# Patient Record
Sex: Female | Born: 1970
Health system: Southern US, Community
[De-identification: ages and names within clinical notes are randomized; demographics above are authoritative.]

## PROBLEM LIST (undated history)

## (undated) DIAGNOSIS — R011 Cardiac murmur, unspecified: Secondary | ICD-10-CM

## (undated) DIAGNOSIS — K219 Gastro-esophageal reflux disease without esophagitis: Secondary | ICD-10-CM

## (undated) HISTORY — DX: Cardiac murmur, unspecified: R01.1

## (undated) HISTORY — DX: Gastro-esophageal reflux disease without esophagitis: K21.9

---

## 1998-12-13 ENCOUNTER — Other Ambulatory Visit: Admission: RE | Admit: 1998-12-13 | Discharge: 1998-12-13 | Payer: Self-pay | Admitting: Gynecology

## 2000-02-05 ENCOUNTER — Inpatient Hospital Stay (HOSPITAL_COMMUNITY): Admission: AD | Admit: 2000-02-05 | Discharge: 2000-02-08 | Payer: Self-pay | Admitting: Obstetrics and Gynecology

## 2000-02-09 ENCOUNTER — Encounter: Admission: RE | Admit: 2000-02-09 | Discharge: 2000-02-28 | Payer: Self-pay | Admitting: Gynecology

## 2000-03-12 ENCOUNTER — Other Ambulatory Visit: Admission: RE | Admit: 2000-03-12 | Discharge: 2000-03-12 | Payer: Self-pay | Admitting: Obstetrics and Gynecology

## 2001-02-07 ENCOUNTER — Other Ambulatory Visit: Admission: RE | Admit: 2001-02-07 | Discharge: 2001-02-07 | Payer: Self-pay | Admitting: Gynecology

## 2002-03-06 ENCOUNTER — Other Ambulatory Visit: Admission: RE | Admit: 2002-03-06 | Discharge: 2002-03-06 | Payer: Self-pay | Admitting: Gynecology

## 2002-03-07 ENCOUNTER — Other Ambulatory Visit: Admission: RE | Admit: 2002-03-07 | Discharge: 2002-03-07 | Payer: Self-pay | Admitting: Gynecology

## 2002-11-05 ENCOUNTER — Inpatient Hospital Stay (HOSPITAL_COMMUNITY): Admission: AD | Admit: 2002-11-05 | Discharge: 2002-11-08 | Payer: Self-pay | Admitting: Obstetrics and Gynecology

## 2003-12-07 ENCOUNTER — Other Ambulatory Visit: Admission: RE | Admit: 2003-12-07 | Discharge: 2003-12-07 | Payer: Self-pay | Admitting: Gynecology

## 2004-12-19 ENCOUNTER — Other Ambulatory Visit: Admission: RE | Admit: 2004-12-19 | Discharge: 2004-12-19 | Payer: Self-pay | Admitting: Gynecology

## 2007-06-18 ENCOUNTER — Encounter: Admission: RE | Admit: 2007-06-18 | Discharge: 2007-06-18 | Payer: Self-pay | Admitting: Gastroenterology

## 2008-06-09 ENCOUNTER — Ambulatory Visit (HOSPITAL_BASED_OUTPATIENT_CLINIC_OR_DEPARTMENT_OTHER): Admission: RE | Admit: 2008-06-09 | Discharge: 2008-06-09 | Payer: Self-pay | Admitting: Orthopedic Surgery

## 2009-02-20 HISTORY — PX: BILATERAL CARPAL TUNNEL RELEASE: SHX6508

## 2010-03-13 ENCOUNTER — Encounter: Payer: Self-pay | Admitting: Gastroenterology

## 2010-06-01 LAB — POCT HEMOGLOBIN-HEMACUE: Hemoglobin: 13.5 g/dL (ref 12.0–15.0)

## 2010-07-05 NOTE — Op Note (Signed)
[name], [name]              ACCOUNT NO.:  [id number]   MEDICAL RECORD NO.:  [id number]          PATIENT TYPE:  AMB   LOCATION:  DSC                          FACILITY:  [facility]   PHYSICIAN:  Katy Fitch. Sypher, M.D. DATE OF BIRTH:  05-Sep-1970   DATE OF PROCEDURE:  06/09/2008  DATE OF DISCHARGE:                               OPERATIVE REPORT   PREOPERATIVE DIAGNOSIS:  Chronic right carpal tunnel syndrome with  positive electrodiagnostic studies and unsuccessful resolution followed  attempted nonoperative measures including steroid injection, splinting,  activity modification, and anti-inflammatory medication.   POSTOPERATIVE DIAGNOSIS:  Chronic right carpal tunnel syndrome with  positive electrodiagnostic studies and unsuccessful resolution followed  attempted nonoperative measures including steroid injection, splinting,  activity modification, and anti-inflammatory medication.   OPERATION:  Release of right transverse carpal ligament.   OPERATING SURGEON:  Katy Fitch. Sypher, MD   ASSISTANT:  None.   ANESTHESIA:  General by LMA.   SUPERVISING ANESTHESIOLOGIST:  Burna Forts, MD   INDICATIONS:  Katherine Horn is a 40 year old woman referred through the  courtesy of [facility] for evaluation and management of an  8-year history of right hand numbness.  Clinical examination revealed  signs of carpal tunnel syndrome.  Electrodiagnostic studies confirmed  bilateral carpal tunnel syndrome.   She was initially treated with steroid injection and splinting.  She had  been using night splinting for a while.  Unfortunately, she was unable  to be relieved for symptoms and now presents for release of a right  transverse carpal ligament.   After informed consent, she is brought to the operating room at this  time.   PROCEDURE IN DETAIL:  Preoperatively, she was interviewed by Dr.  Jacklynn Bue.  General anesthesia by LMA technique was recommended and  accepted.  She is  brought to room 6 of the [facility], placed  in supine position on the operating table, and under Dr. Marlane Mingle  direct supervision, general anesthesia by LMA technique induced.  The  right arm was prepped with Betadine soap and solution and sterilely  draped.  A pneumatic tourniquet was applied to the proximal right  brachium.   Following exsanguination of the right arm with an Esmarch bandage, the  arterial tourniquet was inflated to 220 mmHg.  The procedure commenced  with a short incision in line of the ring finger of the palm.  Subcutaneous tissues were carefully divided revealing the palmar fascia.  This was split longitudinally to the common sensory branch of the median  nerve.   These were followed back to the transverse carpal ligament which was  gently isolated from the median nerve proper with a Insurance risk surveyor.  A tract was created above and below the transverse carpal ligament  followed by release of scissors extending into the distal forearm.  This  widely opened the carpal canal.  No mass or predicaments were noted.   Bleeding points along the margin of the released ligament were  electrocauterized with bipolar current.   The wound was then repaired with intradermal 3-0 Prolene suture.  A  compressive dressing was applied  with a Steri-Strip, sterile gauze,  sterile Webril, and a volar plaster splint maintaining the wrist in 5  degrees of dorsiflexion.  There were no apparent complications.   For aftercare, Katherine Horn is provided a prescription for Percocet 5 mg  one p.o. q.4-6 h. p.r.n. pain, 20 tablets without refill.  She is also  encouraged to use Aleve or Advil as needed.  She also had a 2% lidocaine  block placed in her wound and around the median nerve for postoperative  analgesia.      Katy Fitch Sypher, M.D.  Electronically Signed     RVS/MEDQ  D:  06/09/2008  T:  06/10/2008  Job:  [id number]   cc:   Donia Guiles, M.D.

## 2010-07-08 NOTE — Discharge Summary (Signed)
[facility]  Patient:    Katherine Horn, Katherine Horn                     MRN: [id number] Adm. Date:  [id number] Disc. Date: [id number] Attending:  Marcelle Overlie Dictator:   Leilani Able, P.A.                           Discharge Summary  FINAL DIAGNOSES:              1. Intrauterine pregnancy at [redacted] weeks gestation.                               2. Arrest of descent.                               3. Failed vacuum extraction.  PROCEDURES:                   Primary low transverse cesarean section.  SURGEON:                      Marcelle Overlie, M.D.  COMPLICATIONS:                None.  HISTORY OF PRESENT ILLNESS:   This 40 year old, G1, P0, presents at 38 weeks with spontaneous rupture of membranes.  The patient was admitted.  She dilated to complete/complete and pushed for about an hour and became exhausted. Therefore, a vacuum extractor was placed at a +2 station.  The patient had no gain of station with about five contractions and there were no ______.  At this point, it was felt to proceed with a cesarean section secondary to arrest of descent.  HOSPITAL COURSE:              The patient was taken to the operating room on February 05, 2000, by Marcelle Overlie, M.D., where a primary low transverse cesarean section was performed with delivery of a 6 pound 7 ounce female infant with Apgars of 8 and 9.  The delivery went without complication.  The patients postoperative course was benign without significant fever.  DISPOSITION:                  She was felt ready for discharge on postoperative day #3.  DIET:                         She was sent home on a regular diet.  ACTIVITY:                     Told to decrease activities.  DISCHARGE MEDICATIONS:        Told to continue prenatal vitamins.  She was given Tylox, #30, one to two every four hours as needed for pain.  She was also given Motrin 600 mg, #30, one every six hours as needed for  pain.  FOLLOW-UP:                    Told to follow up in the office in four weeks. DD:  03/05/00 TD:  03/05/00 Job: [id number]

## 2010-07-08 NOTE — Discharge Summary (Signed)
   NAMEFRANCENE, MCERLEAN                        ACCOUNT NO.:  [id number]   MEDICAL RECORD NO.:  [id number]                   PATIENT TYPE:  INP   LOCATION:  9119                                 FACILITY:  [facility]   PHYSICIAN:  Carrington Clamp, M.D.              DATE OF BIRTH:  December 25, 1970   DATE OF ADMISSION:  11/05/2002  DATE OF DISCHARGE:  11/08/2002                                 DISCHARGE SUMMARY   ADMITTING DIAGNOSIS:  History of prior cesarean section, desires repeat.   DISCHARGE DIAGNOSIS:  History of prior cesarean section, desires repeat.   PERTINENT PROCEDURES PERFORMED:  Low transverse cesarean section.   PERTINENT TEST RESULTS:  Discharge hemoglobin of 10.9 and 30.9.   HOSPITAL COURSE:  Please refer to the written History and Physical on the  chart, but briefly this was a 40 year old G2 P1-0-0-1 at 38+ weeks who  declined a VBAC and desired a repeat cesarean section.  The patient was  admitted on November 05, 2002 for the above-named procedure and underwent  it without complication.  By postoperative day #3 she was eating,  ambulating, and voiding.  She had delivered a female infant who had been  circumcised during hospital stay.  She was discharged with the following:  1. Activity:  Routine.  2. Diet:  Routine.  3. Medications:  Percocet 5 mg one p.o. q.4-6h. p.r.n. pain.  4. Instructions:  Routine.   The patient's staples were removed on postoperative day #3 before discharge.  The patient was discharged with her female infant who had been circumcised  without complication on postoperative day #3 to home.                                               Carrington Clamp, M.D.    MH/MEDQ  D:  11/26/2002  T:  11/26/2002  Job:  [id number]

## 2010-07-08 NOTE — Op Note (Signed)
[facility]  Patient:    Katherine Horn, Katherine Horn                     MRN: [id number] Proc. Date: 02/05/00 Adm. Date:  [id number] Attending:  Marcelle Overlie                           Operative Report  PREOPERATIVE DIAGNOSES:       1. Intrauterine pregnancy at 38 weeks.                               2. Arrest of descent.                               3. Failed vacuum extraction.  POSTOPERATIVE DIAGNOSES:      1. Intrauterine pregnancy at 38 weeks.                               2. Arrest of descent.                               3. Failed vacuum extraction.  OPERATION/PROCEDURE:          Primary low transverse cesarean section.  SURGEON:                      Marcelle Overlie, M.D.  ANESTHESIA:                   Epidural.  ESTIMATED BLOOD LOSS:         Estimated blood loss was 500 cc.  FINDINGS:                     The patient delivered a viable female infant, assigned Apgar scores of 8 at one minute and 9 at five minutes, with a weight of 6 pounds 7 ounces.  COMPLICATIONS:                None.  DESCRIPTION OF PROCEDURE:     The patient was taken to the operating room and her epidural was dosed.  The abdomen was prepped and draped in the usual sterile fashion and a Foley catheter was already placed in the bladder.  Using the scalpel a low transverse incision was made down to the fascia.  The fascia was scored in the midline and the incision extended laterally.  Using Mayo scissors a Pfannenstiel incision was created and the rectus muscles were separated and the peritoneum entered sharply.  The bladder blade was inserted and the lower uterine segment was identified.  A bladder flap was created sharply and then digitally and the bladder blade was then readjusted.  A low transverse incision was made in the uterus and extended laterally.  The amniotic fluid was noted to be clear upon entry into the uterine cavity.  The infant was in cephalic presentation and  was delivered without difficulty.  The infant was a viable female, assigned Apgar scores of 8 at one minute and 9 at five minutes, weighing 6 pounds 7 ounces.  The cord blood was obtained after the cord was clamped and cut and the placenta was manually removed, and noted to be intact.  The uterus  was closed in a single layer using 0 chromic continuous running locked stitch.  The peritoneum was then closed using 0 Vicryl in a continuous running stitch.  The rectus muscles were reapproximated using the same 0 Vicryl.  The fascia was closed using 0 Vicryl starting at each corner and meeting in the midline using a continuous running stitch. After inspection of the subcutaneous layer the skin was closed with staples. Sponge, needle, and instrument counts were correct x 2.  The patient tolerated the procedure well and went to the recovery room in stable condition. DD:  02/05/00 TD:  02/06/00 Job: [id number]

## 2010-07-08 NOTE — Op Note (Signed)
Katherine Horn, Katherine Horn                        ACCOUNT NO.:  [id number]   MEDICAL RECORD NO.:  [id number]                   PATIENT TYPE:  INP   LOCATION:  9119                                 FACILITY:  [facility]   PHYSICIAN:  Malva Limes, M.D.                 DATE OF BIRTH:  [birth year]   DATE OF PROCEDURE:  11/05/2002  DATE OF DISCHARGE:                                 OPERATIVE REPORT   PREOPERATIVE DIAGNOSES:  1. Intrauterine pregnancy at term.  2. The patient has a history of previous cesarean section.  3. Patient declines attempt at vaginal birth after cesarean section.   POSTOPERATIVE DIAGNOSES:  1. Intrauterine pregnancy at term.  2. The patient has a history of previous cesarean section.  3. Patient declines attempt at vaginal birth after cesarean section.   PROCEDURE:  Repeat low transverse cesarean section.   SURGEON:  Malva Limes, M.D.   ASSISTANT:  Luvenia Redden, M.D.   ANESTHESIA:  Spinal.   ANTIBIOTICS:  Ancef 1 g.   DRAINS:  Foley to bedside drainage.   ESTIMATED BLOOD LOSS:  900 mL.   COMPLICATIONS:  None.   SPECIMENS:  None.   FINDINGS:  The patient had normal fallopian tubes and ovaries bilaterally.  The uterus appeared to be normal.  The uterine scar was intact.  There was  no evidence of adhesions or endometriosis.  The patient delivered one live  viable white female infant weighing 7 pounds 7 ounces.   DESCRIPTION OF PROCEDURE:  The patient was taken to the operating room,  where spinal anesthetic was administered.  She was then placed in the dorsal  supine position with a left lateral tilt.  She was prepped with Hibiclens  and a Foley catheter inserted.  She was draped in the usual fashion for this  procedure.  A Pfannenstiel incision was made through the previous scar.  This was carried down to the fascia.  The fascia was entered in the midline  and extended laterally.  The rectus muscles were dissected from the fascia  with the Bovie.  The  rectus muscles were divided in the midline and taken  superiorly and inferiorly.  The bladder flap was taken down sharply.  A low  transverse uterine incision was made in the midline and extended laterally  with blunt dissection.  The amniotic sac was entered.  The fluid was noted  to be clear.  The infant was delivered in the vertex presentation.  On  delivery of the head, the oropharynx and nostrils were bulb-suctioned.  The  remaining infant was then delivered, the cord doubly clamped and cut, and  the infant handed to the waiting NICU team.  Cord blood was then obtained.  The placenta was manually removed.  The uterus was exteriorized.  The  uterine cavity was cleaned with a wet lap.  The uterine incision was closed  with 0 chromic in a  running locking fashion.  The bladder flap was closed  using 3-0 chromic in a running fashion.  The uterus was placed back in the  abdominal cavity.  Hemostasis was checked and found to be adequate.  The  parietal peritoneum and the rectus muscles were reapproximated in the  midline using 3-0 chromic in a running fashion.  The fascia was then closed  using 0 Monocryl suture in a running fashion.  Subcuticular tissue was made  hemostatic with the Bovie.  Stainless steel clips were used to close the  skin.  The patient tolerated the procedure well.  She was taken to the  recovery room in stable condition.  Instrument and lap counts were correct  x2.                                               Malva Limes, M.D.    MA/MEDQ  D:  11/05/2002  T:  11/05/2002  Job:  [id number]

## 2010-08-09 ENCOUNTER — Ambulatory Visit (HOSPITAL_BASED_OUTPATIENT_CLINIC_OR_DEPARTMENT_OTHER)
Admission: RE | Admit: 2010-08-09 | Discharge: 2010-08-09 | Disposition: A | Discharge status: Home / Self Care | Payer: 59 | Source: Ambulatory Visit | Attending: Orthopedic Surgery | Admitting: Orthopedic Surgery

## 2010-08-09 DIAGNOSIS — G5600 Carpal tunnel syndrome, unspecified upper limb: Secondary | ICD-10-CM | POA: Insufficient documentation

## 2010-08-09 DIAGNOSIS — Z87891 Personal history of nicotine dependence: Secondary | ICD-10-CM | POA: Insufficient documentation

## 2010-08-09 LAB — POCT HEMOGLOBIN-HEMACUE: Hemoglobin: 13.7 g/dL (ref 12.0–15.0)

## 2010-08-12 NOTE — Op Note (Signed)
NAMEDAPHANE, ODEKIRK NO.:  [id number]  MEDICAL RECORD NO.:  [id number]  LOCATION:                                 FACILITY:  PHYSICIAN:  Katy Fitch. Roald Lukacs, M.D. 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  OPERATION:  Release of left transverse carpal ligament.  OPERATING SURGEON:  Katy Fitch. Joshiah Traynham, MD  ASSISTANT:  Marveen Reeks Dasnoit, PA-C  ANESTHESIA:  General by LMA.  SUPERVISING ANESTHESIOLOGIST:  Germaine Pomfret, MD  INDICATIONS:  Katherine Horn is a 40 year old [place] [employer] employee who presented in 2010 for evaluation of bilateral hand discomfort and numbness.  Clinical examination at that time revealed evidence of bilateral carpal tunnel syndrome.  Electrodiagnostic studies were obtained which revealed moderate right carpal tunnel syndrome and mild left carpal tunnel syndrome.  Katherine Horn underwent a right carpal tunnel release in 2010 with excellent results.  We subsequently treated her left hand with nonoperative technique for the past 2 years, however her symptoms have been refractory to splinting, antiinflammatory medication, and steroid injection.  Therefore, she presents for release of the left transverse carpal ligament at this time.  Preoperatively, she was reminded of the potential risks and benefits of surgery.  She is noted to be allergic to ERYTHROMYCIN and SULFA.  After informed consent, she was brought to the operating room at this time.  Katherine Horn is brought to room 2 of the [facility], placed supine position on the operating room table.  Preoperatively, she was interviewed by Dr. Jairo Ben, anesthesiologist, and had detailed anesthesia  informed consent.  General anesthesia by LMA technique was recommended and accepted by Katherine Horn.  Under Dr. Edison Pace direct supervision, general anesthesia was induced followed by routine Betadine scrub and paint of the left upper extremity.  Left arm was exsanguinated with Esmarch bandage and arterial tourniquet on the proximal brachium inflated to 220 mmHg.  A routine surgical time- out was accomplished.  Procedure commenced with a short incision in the line of the ring finger in the palm.  Subcutaneous tissues were carefully divided and taken care to thoroughly identify the palmar fascia.  The fascia was split in line of its fibers revealing the mid palmar space.  The ulnar artery superficial palmar arch and the distal margin of the transverse carpal ligament.  The carpal canal was sounded with a Penfield 4 elevator separating the bursa from the deep surface of the ligament.  Ligament was then released subcutaneously with scissors extending into the distal forearm.  This widely opened carpal canal.  No masses or other pigments were noted.  Bleeding points along the margin of the released ligament were electrocauterized with bipolar current, followed by repair of the skin with intradermal 3-0 Prolene suture.  A compressive dressing was applied with a volar plaster splint maintaining the wrist in 10 degrees of dorsiflexion.  Katherine Horn was awakened from general anesthesia and transferred to the recovery room in stable  signs.  She will be discharged with the care of her husband with a prescription for Vicodin 5 mg 1 p.o. 4-6 h. p.r.n. pain.  She suggested to use Aleve and/or Tylenol for mild pain.     Katy Fitch Amilliana Hayworth, M.D.     RVS/MEDQ  D:  08/09/2010  T:  08/09/2010  Job:  [id number]  Electronically Signed by Josephine Igo M.D. on 08/12/2010 08:30:02 AM

## 2012-09-10 ENCOUNTER — Other Ambulatory Visit: Payer: Self-pay | Admitting: Gynecology

## 2012-09-10 DIAGNOSIS — R928 Other abnormal and inconclusive findings on diagnostic imaging of breast: Secondary | ICD-10-CM

## 2012-09-16 ENCOUNTER — Ambulatory Visit
Admission: RE | Admit: 2012-09-16 | Discharge: 2012-09-16 | Disposition: A | Discharge status: Home / Self Care | Payer: 59 | Source: Ambulatory Visit | Attending: Gynecology | Admitting: Gynecology

## 2012-09-16 DIAGNOSIS — R928 Other abnormal and inconclusive findings on diagnostic imaging of breast: Secondary | ICD-10-CM

## 2012-09-30 LAB — LAB REPORT - SCANNED
CHOLESTEROL, TOTAL: 162
Glucose: 73
HDL Cholesterol: 76
LDL CHOLESTEROL (CALC): 77
Triglycerides: 45

## 2014-11-11 IMAGING — MG MM DIGITAL DIAGNOSTIC UNILAT*R*
2 series · 2 of 2 positions shown · non-contrast
Comparison: None.

CLINICAL DATA: The patient returns for evaluation of a possible
mass in the right upper outer quadrant noted on recent screening
mammogram from [facility]HOSPITAL[facility] dated 09/04/2012.

DIGITAL DIAGNOSTIC RIGHT MAMMOGRAM

[R MLO]
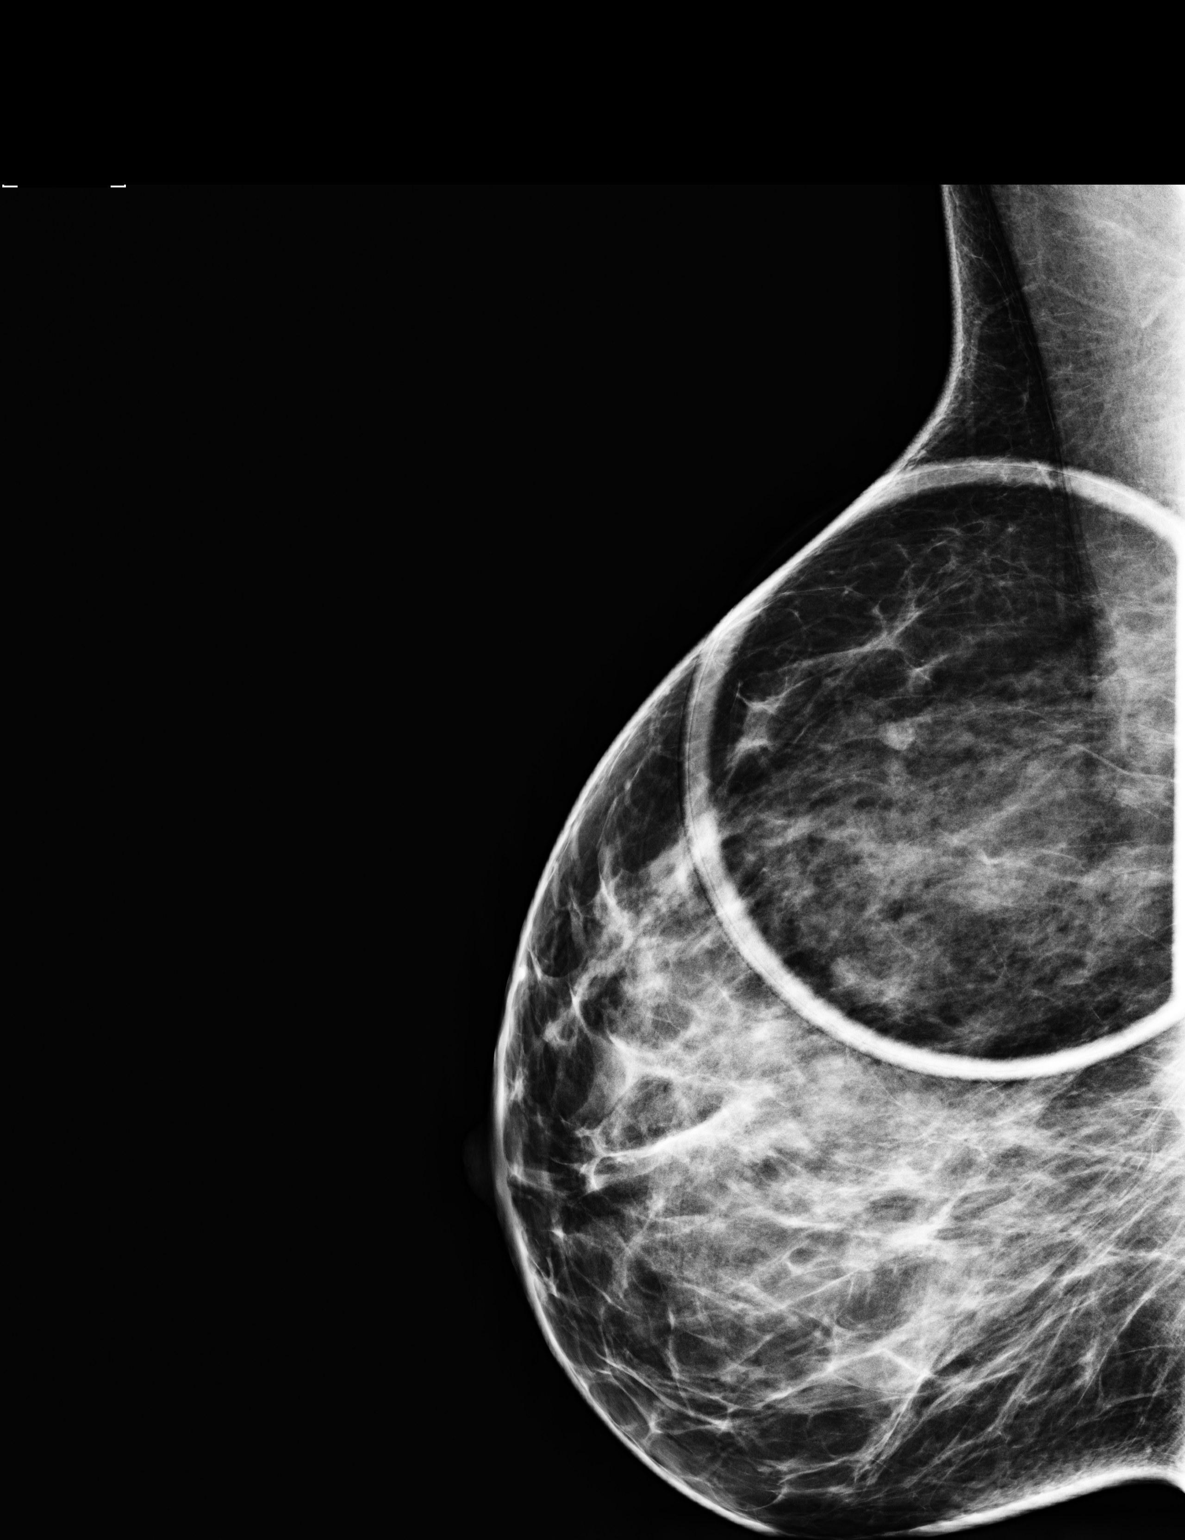

[R CC]
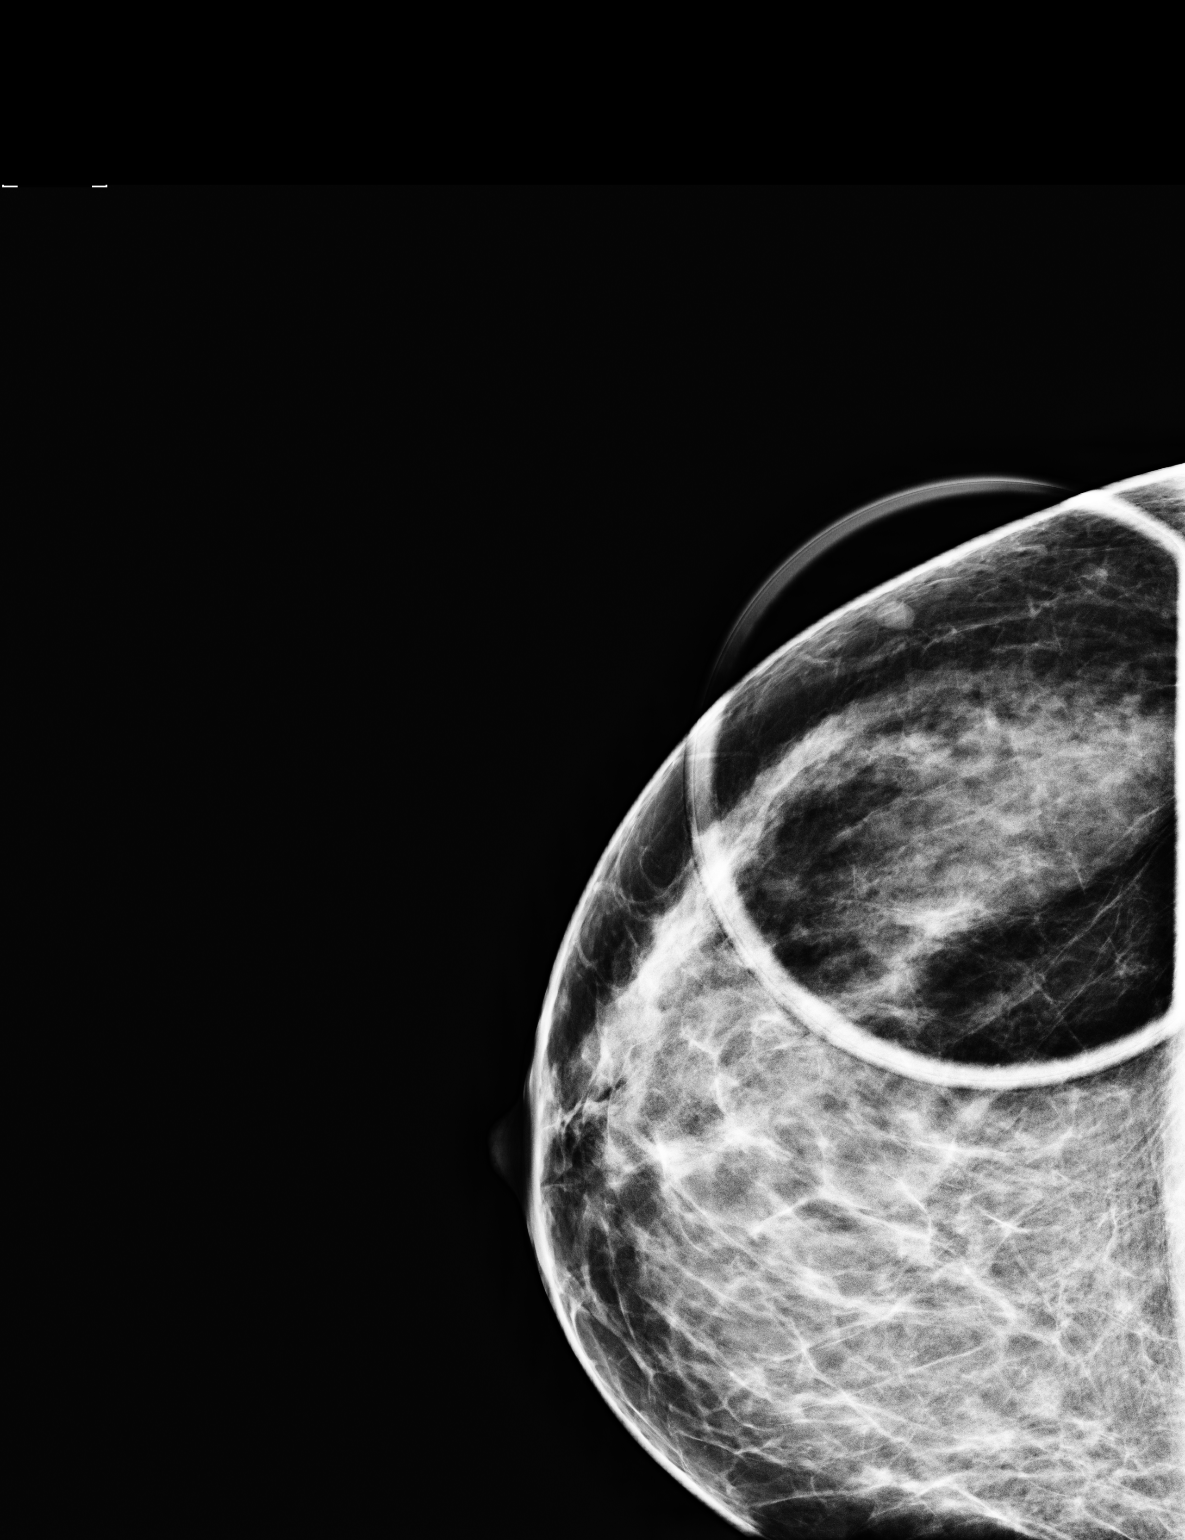

[2 of 2 positions shown; findings below may reference images not displayed]

FINDINGS: ACR Breast Density Category c:  The breast tissue is
heterogeneously dense, which may obscure small masses.

Additional views demonstrate an intramammary lymph node in the
right upper outer quadrant posteriorly.
IMPRESSION: Benign intramammary lymph node in the right upper outer quadrant
posteriorly.

RECOMMENDATION:
Yearly screening mammography is suggested.

I have discussed the findings and recommendations with the patient.
Results were also provided in writing at the conclusion of the
visit.  If applicable, a reminder letter will be sent to the
patient regarding her next appointment.

BI-RADS CATEGORY 2:  Benign finding(s).

## 2016-05-29 DIAGNOSIS — D2262 Melanocytic nevi of left upper limb, including shoulder: Secondary | ICD-10-CM | POA: Diagnosis not present

## 2016-05-29 DIAGNOSIS — D225 Melanocytic nevi of trunk: Secondary | ICD-10-CM | POA: Diagnosis not present

## 2016-05-29 DIAGNOSIS — L821 Other seborrheic keratosis: Secondary | ICD-10-CM | POA: Diagnosis not present

## 2016-09-04 DIAGNOSIS — H5213 Myopia, bilateral: Secondary | ICD-10-CM | POA: Diagnosis not present

## 2016-10-04 DIAGNOSIS — Z1231 Encounter for screening mammogram for malignant neoplasm of breast: Secondary | ICD-10-CM | POA: Diagnosis not present

## 2016-10-04 DIAGNOSIS — Z124 Encounter for screening for malignant neoplasm of cervix: Secondary | ICD-10-CM | POA: Diagnosis not present

## 2016-10-04 DIAGNOSIS — Z01419 Encounter for gynecological examination (general) (routine) without abnormal findings: Secondary | ICD-10-CM | POA: Diagnosis not present

## 2016-10-12 ENCOUNTER — Ambulatory Visit: Payer: Self-pay | Admitting: Family Medicine

## 2016-12-12 ENCOUNTER — Encounter: Payer: Self-pay | Admitting: Family Medicine

## 2016-12-12 ENCOUNTER — Ambulatory Visit (INDEPENDENT_AMBULATORY_CARE_PROVIDER_SITE_OTHER): Payer: 59 | Admitting: Family Medicine

## 2016-12-12 VITALS — BP 108/78 | HR 86 | Temp 98.0°F | Ht 63.5 in | Wt 149.5 lb

## 2016-12-12 DIAGNOSIS — Z131 Encounter for screening for diabetes mellitus: Secondary | ICD-10-CM | POA: Diagnosis not present

## 2016-12-12 DIAGNOSIS — Z13220 Encounter for screening for lipoid disorders: Secondary | ICD-10-CM

## 2016-12-12 DIAGNOSIS — Z7189 Other specified counseling: Secondary | ICD-10-CM

## 2016-12-12 DIAGNOSIS — Z0000 Encounter for general adult medical examination without abnormal findings: Secondary | ICD-10-CM

## 2016-12-12 NOTE — Patient Instructions (Signed)
We'll request your records from [place].  Go to the lab on the way out.  We'll contact you with your lab report. I would get a flu shot each fall.   Take care.  Glad to see you.  Update me as needed.

## 2016-12-12 NOTE — Progress Notes (Signed)
CPE- See plan.  Routine anticipatory guidance given to patient.  See health maintenance.  The possibility exists that previously documented standard health maintenance information may have been brought forward from a previous encounter into this note.  If needed, that same information has been updated to reflect the current situation based on today's encounter.    Tetanus may be up to date, requesting old records.  Flu encouraged, declined.  D/w pt. No medical reason not to vaccinate.  PNA and shingles not due, d/w pt.  Mammogram up to date Pap 2018 Colon CA screening due at 51 Living will d/w pt.  Husband designated if patient were incapacitated.  DXA not due.  HIV screening done with pregnancy.  Diet and exercise d/w pt.   Routine labs pending.  See notes on labs.    PMH and SH reviewed  Meds, vitals, and allergies reviewed.   ROS: Per HPI.  Unless specifically indicated otherwise in HPI, the patient denies:  General: fever. Eyes: acute vision changes ENT: sore throat Cardiovascular: chest pain Respiratory: SOB GI: vomiting GU: dysuria Musculoskeletal: acute back pain Derm: acute rash Neuro: acute motor dysfunction Psych: worsening mood Endocrine: polydipsia Heme: bleeding Allergy: hayfever  GEN: nad, alert and oriented HEENT: mucous membranes moist NECK: supple w/o LA CV: rrr. No murmur noted on exam.  PULM: ctab, no inc wob ABD: soft, +bs EXT: no edema SKIN: no acute rash

## 2016-12-13 LAB — LIPID PANEL
CHOLESTEROL: 142 mg/dL (ref 0–200)
HDL: 60.9 mg/dL (ref >39.00)
LDL Cholesterol: 63 mg/dL (ref 0–99)
NonHDL: 80.81
Total CHOL/HDL Ratio: 2
Triglycerides: 88 mg/dL (ref 0.0–149.0)
VLDL: 17.6 mg/dL (ref 0.0–40.0)

## 2016-12-13 LAB — GLUCOSE, RANDOM: Glucose, Bld: 86 mg/dL (ref 70–99)

## 2016-12-15 ENCOUNTER — Encounter: Payer: Self-pay | Admitting: Family Medicine

## 2016-12-15 ENCOUNTER — Encounter: Payer: Self-pay | Admitting: *Deleted

## 2016-12-15 DIAGNOSIS — Z7189 Other specified counseling: Secondary | ICD-10-CM | POA: Insufficient documentation

## 2016-12-15 DIAGNOSIS — Z0000 Encounter for general adult medical examination without abnormal findings: Secondary | ICD-10-CM | POA: Insufficient documentation

## 2016-12-15 DIAGNOSIS — Z1211 Encounter for screening for malignant neoplasm of colon: Secondary | ICD-10-CM | POA: Insufficient documentation

## 2016-12-15 NOTE — Assessment & Plan Note (Signed)
Tetanus may be up to date, requesting old records.  Flu encouraged, declined.  D/w pt. No medical reason not to vaccinate.  PNA and shingles not due, d/w pt.  Mammogram up to date Pap 2018 Colon CA screening due at 57 Living will d/w pt.  Husband designated if patient were incapacitated.  DXA not due.  HIV screening done with pregnancy.  Diet and exercise d/w pt.   Routine labs pending.  See notes on labs.

## 2016-12-15 NOTE — Assessment & Plan Note (Signed)
Living will d/w pt.  Husband designated if patient were incapacitated.  

## 2017-01-23 ENCOUNTER — Encounter: Payer: Self-pay | Admitting: Family Medicine

## 2017-04-23 ENCOUNTER — Encounter: Payer: Self-pay | Admitting: Family Medicine

## 2017-04-23 ENCOUNTER — Ambulatory Visit: Payer: 59 | Admitting: Family Medicine

## 2017-04-23 VITALS — BP 120/68 | HR 87 | Temp 98.3°F | Wt 149.5 lb

## 2017-04-23 DIAGNOSIS — R350 Frequency of micturition: Secondary | ICD-10-CM | POA: Diagnosis not present

## 2017-04-23 LAB — POC URINALSYSI DIPSTICK (AUTOMATED)
BILIRUBIN UA: NEGATIVE
Blood, UA: NEGATIVE
Glucose, UA: NEGATIVE
Ketones, UA: NEGATIVE
LEUKOCYTES UA: NEGATIVE
NITRITE UA: NEGATIVE
PH UA: 6 (ref 5.0–8.0)
Protein, UA: NEGATIVE
Spec Grav, UA: >=1.03 — AB (ref 1.010–1.025)
Urobilinogen, UA: 0.2 E.U./dL

## 2017-04-23 NOTE — Patient Instructions (Signed)
Good to see you today  Drink mostly water- 6-8 glasses today and avoid artificial sweeteners/soda  Will notify you of your culture results on Wed.

## 2017-04-23 NOTE — Progress Notes (Signed)
   Subjective:    Patient ID: Katherine Horn, female    DOB: 03-03-1970, 47 y.o.   MRN: [id number]  HPI This is a 47 yo female who presents today with urinary frequency x 3 days, felt like she had to go frequently and didn't void much when she went. No burning. No abdominal pain, some pressure, a little back pain which is normal for her. No fever. No vaginal discharge, itching or burning.   Past Medical History:  Diagnosis Date  . GERD (gastroesophageal reflux disease)   . Heart murmur    in childhood   Past Surgical History:  Procedure Laterality Date  . BILATERAL CARPAL TUNNEL RELEASE Bilateral 2011  . CESAREAN SECTION     2001 and 2004   Family History  Problem Relation Age of Onset  . Hyperlipidemia Mother   . Bladder Cancer Father   . Colon cancer Neg Hx   . Breast cancer Neg Hx    Social History   Tobacco Use  . Smoking status: Former Research scientist (life sciences)  . Smokeless tobacco: Never Used  Substance Use Topics  . Alcohol use: Yes    Comment: 0-2 per week, minimal  . Drug use: No      Review of Systems Per HPI    Objective:   Physical Exam Physical Exam  Constitutional: She is oriented to person, place, and time. She appears well-developed and well-nourished. No distress.  HENT:  Head: Normocephalic and atraumatic.  Cardiovascular: Normal rate, regular rhythm and normal heart sounds.   Pulmonary/Chest: Effort normal and breath sounds normal.  Abdominal: Soft. She exhibits no distension. There is no tenderness. There is no rebound, no guarding and no CVA tenderness.  Neurological: She is alert and oriented to person, place, and time.  Skin: Skin is warm and dry. She is not diaphoretic.  Psychiatric: She has a normal mood and affect. Her behavior is normal. Judgment and thought content normal.  Vitals reviewed.  BP 120/68   Pulse 87   Temp 98.3 F (36.8 C) (Oral)   Wt 149 lb 8 oz (67.8 kg)   LMP 04/03/2017 (Approximate)   SpO2 98%   BMI 26.07 kg/m  Wt Readings  from Last 3 Encounters:  04/23/17 149 lb 8 oz (67.8 kg)  12/12/16 149 lb 8 oz (67.8 kg)   Results for orders placed or performed in visit on 04/23/17  POCT Urinalysis Dipstick (Automated)  Result Value Ref Range   Color, UA yellow    Clarity, UA clear    Glucose, UA neg    Bilirubin, UA neg    Ketones, UA neg    Spec Grav, UA >=1.030 (A) 1.010 - 1.025   Blood, UA neg    pH, UA 6.0 5.0 - 8.0   Protein, UA neg    Urobilinogen, UA 0.2 0.2 or 1.0 E.U./dL   Nitrite, UA neg    Leukocytes, UA Negative Negative       Assessment & Plan:  1. Urinary frequency - urine sample very concentrated, could explain symptoms - POCT Urinalysis Dipstick (Automated) - Urine Culture -  Patient Instructions  Good to see you today  Drink mostly water- 6-8 glasses today and avoid artificial sweeteners/soda  Will notify you of your culture results on Wed.     Clarene Reamer, FNP-BC  [facility]  04/23/2017 12:31 PM

## 2017-04-25 ENCOUNTER — Other Ambulatory Visit: Payer: Self-pay | Admitting: Family Medicine

## 2017-04-25 DIAGNOSIS — N3090 Cystitis, unspecified without hematuria: Secondary | ICD-10-CM

## 2017-04-25 LAB — URINE CULTURE
MICRO NUMBER: 90275409
SPECIMEN QUALITY: ADEQUATE

## 2017-04-25 MED ORDER — NITROFURANTOIN MONOHYD MACRO 100 MG PO CAPS: 100.0000 mg | ORAL_CAPSULE | Freq: Two times a day (BID) | ORAL | 0 refills | Status: DC

## 2017-04-25 NOTE — Progress Notes (Signed)
n

## 2017-05-08 ENCOUNTER — Encounter: Payer: Self-pay | Admitting: Family Medicine

## 2017-05-30 ENCOUNTER — Ambulatory Visit: Payer: 59 | Admitting: Family Medicine

## 2017-05-30 ENCOUNTER — Encounter: Payer: Self-pay | Admitting: Family Medicine

## 2017-05-30 VITALS — BP 120/78 | HR 100 | Temp 101.4°F | Wt 142.5 lb

## 2017-05-30 DIAGNOSIS — R509 Fever, unspecified: Secondary | ICD-10-CM | POA: Diagnosis not present

## 2017-05-30 LAB — POC INFLUENZA A&B (BINAX/QUICKVUE)
INFLUENZA A, POC: NEGATIVE
Influenza B, POC: NEGATIVE

## 2017-05-30 LAB — POCT RAPID STREP A (OFFICE): RAPID STREP A SCREEN: NEGATIVE

## 2017-05-30 MED ORDER — AMOXICILLIN-POT CLAVULANATE 875-125 MG PO TABS: 1.0000 | ORAL_TABLET | Freq: Two times a day (BID) | ORAL | 0 refills | Status: DC

## 2017-05-30 NOTE — Patient Instructions (Signed)
Presumed sinus infection.  Rest and fluids.  Tylenol if needed.  Start augmentin.  Take care.  Glad to see you.  Update [place] as needed.

## 2017-05-30 NOTE — Progress Notes (Signed)
Sx started about 5 days ago.  Cough, didn't feel well in general.  The cough continued for the few days.  Then more congestion, headaches.  ST.  Fever.  No vomiting.  No diarrhea.  No severe aches but she doesn't feel well in general.  Discolored rhinorrhea.     Out of work today.   Meds, vitals, and allergies reviewed.   ROS: Per HPI unless specifically indicated in ROS section   GEN: nad, alert and oriented HEENT: mucous membranes moist, tm w/o erythema, nasal exam w/o erythema, clear discharge noted,  OP with cobblestoning NECK: supple w/o LA CV: rrr.   PULM: ctab, no inc wob EXT: no edema SKIN: well perfused Sinus area x 4 sore in general but not specifically ttp.

## 2017-05-31 DIAGNOSIS — R509 Fever, unspecified: Secondary | ICD-10-CM | POA: Insufficient documentation

## 2017-05-31 NOTE — Assessment & Plan Note (Signed)
Flu and strep test both negative.  Discussed with patient at office visit.  Presumed to have sinus infection.  Discussed with patient about options.  Start Augmentin.  Supportive care otherwise.  Out of work for now.  Work note given.  Update me as needed.  Okay for outpatient follow-up.

## 2017-10-19 DIAGNOSIS — Z01419 Encounter for gynecological examination (general) (routine) without abnormal findings: Secondary | ICD-10-CM | POA: Diagnosis not present

## 2017-10-19 DIAGNOSIS — Z1231 Encounter for screening mammogram for malignant neoplasm of breast: Secondary | ICD-10-CM | POA: Diagnosis not present

## 2017-11-27 ENCOUNTER — Telehealth: Payer: Self-pay

## 2017-11-27 NOTE — Telephone Encounter (Signed)
Copied from [place] [phone]. Topic: General - Other >> Nov 27, 2017  1:18 PM Yvette Rack wrote: Reason for CRM: Pt request Tetanus shot. Please contact pt for scheduling. Cb# [phone] >> Nov 27, 2017  1:49 PM Helene Shoe, LPN wrote: Pt is going to ck with ins co to ck on coverage for tetanus shot and if pt wants to schedule at [facility] she will cb.

## 2017-12-06 ENCOUNTER — Ambulatory Visit (INDEPENDENT_AMBULATORY_CARE_PROVIDER_SITE_OTHER): Payer: 59

## 2017-12-06 DIAGNOSIS — Z23 Encounter for immunization: Secondary | ICD-10-CM

## 2018-04-15 DIAGNOSIS — H5213 Myopia, bilateral: Secondary | ICD-10-CM | POA: Diagnosis not present

## 2018-10-23 ENCOUNTER — Ambulatory Visit (INDEPENDENT_AMBULATORY_CARE_PROVIDER_SITE_OTHER): Payer: 59

## 2018-10-23 DIAGNOSIS — Z23 Encounter for immunization: Secondary | ICD-10-CM

## 2019-08-26 ENCOUNTER — Other Ambulatory Visit: Payer: Self-pay

## 2019-08-26 ENCOUNTER — Ambulatory Visit (INDEPENDENT_AMBULATORY_CARE_PROVIDER_SITE_OTHER): Payer: 59 | Admitting: Family Medicine

## 2019-08-26 ENCOUNTER — Encounter: Payer: Self-pay | Admitting: Family Medicine

## 2019-08-26 VITALS — BP 110/82 | HR 84 | Temp 97.4°F | Ht 63.0 in | Wt 155.1 lb

## 2019-08-26 DIAGNOSIS — Z0000 Encounter for general adult medical examination without abnormal findings: Secondary | ICD-10-CM | POA: Diagnosis not present

## 2019-08-26 DIAGNOSIS — Z7189 Other specified counseling: Secondary | ICD-10-CM

## 2019-08-26 DIAGNOSIS — Z131 Encounter for screening for diabetes mellitus: Secondary | ICD-10-CM

## 2019-08-26 LAB — POCT CBG (FASTING - GLUCOSE)-MANUAL ENTRY: Glucose Fasting, POC: 87 mg/dL (ref 70–99)

## 2019-08-26 NOTE — Patient Instructions (Addendum)
Check with your insurance to see if they will cover the Tdap shot.  It may be cheaper at the pharmacy.   Update me as needed.   Take care.  Glad to see you. Thanks for your effort.

## 2019-08-26 NOTE — Progress Notes (Signed)
This visit occurred during the SARS-CoV-2 public health emergency.  Safety protocols were in place, including screening questions prior to the visit, additional usage of staff PPE, and extensive cleaning of exam room while observing appropriate contact time as indicated for disinfecting solutions.  CPE- See plan.  Routine anticipatory guidance given to patient.  See health maintenance.  The possibility exists that previously documented standard health maintenance information may have been brought forward from a previous encounter into this note.  If needed, that same information has been updated to reflect the current situation based on today's encounter.    Tetanus d/w pt.  See avs.   Flu 2020 PNA and shingles not due, d/w pt.  covid vaccine d/w pt.   Mammogram up to date per gynecology- Jerelyn Charles Pap per gyn, UTD per patient.   Colon CA screening due at 51 Living will d/w pt.  Husband designated if patient were incapacitated.  DXA not due.  HIV and HCV screening done with pregnancy.  Diet and exercise d/w pt.   Routine labs pending.  See notes on labs.  PMH and SH reviewed  Meds, vitals, and allergies reviewed.   ROS: Per HPI.  Unless specifically indicated otherwise in HPI, the patient denies:  General: fever. Eyes: acute vision changes ENT: sore throat Cardiovascular: chest pain Respiratory: SOB GI: vomiting GU: dysuria Musculoskeletal: acute back pain Derm: acute rash Neuro: acute motor dysfunction Psych: worsening mood Endocrine: polydipsia Heme: bleeding Allergy: hayfever  GEN: nad, alert and oriented HEENT: NCAT NECK: supple w/o LA CV: rrr. PULM: ctab, no inc wob ABD: soft, +bs EXT: no edema SKIN: no acute rash

## 2019-08-29 NOTE — Assessment & Plan Note (Signed)
Living will d/w pt.  Husband designated if patient were incapacitated.  

## 2019-08-29 NOTE — Assessment & Plan Note (Signed)
Tetanus d/w pt.  See avs.   Flu 2020 PNA and shingles not due, d/w pt.  covid vaccine d/w pt.   Mammogram up to date per gynecology- Jerelyn Charles Pap per gyn, UTD per patient.   Colon CA screening due at 51 Living will d/w pt.  Husband designated if patient were incapacitated.  DXA not due.  HIV and HCV screening done with pregnancy.  Diet and exercise d/w pt.   Routine labs pending.  See notes on labs.

## 2019-09-09 ENCOUNTER — Other Ambulatory Visit: Payer: Self-pay

## 2019-09-09 ENCOUNTER — Ambulatory Visit (INDEPENDENT_AMBULATORY_CARE_PROVIDER_SITE_OTHER): Payer: 59

## 2019-09-09 DIAGNOSIS — Z23 Encounter for immunization: Secondary | ICD-10-CM

## 2020-08-20 ENCOUNTER — Other Ambulatory Visit: Payer: 59

## 2020-08-27 ENCOUNTER — Ambulatory Visit (INDEPENDENT_AMBULATORY_CARE_PROVIDER_SITE_OTHER): Payer: 59 | Admitting: Family Medicine

## 2020-08-27 ENCOUNTER — Encounter: Payer: Self-pay | Admitting: Family Medicine

## 2020-08-27 ENCOUNTER — Other Ambulatory Visit: Payer: Self-pay

## 2020-08-27 VITALS — BP 116/78 | HR 73 | Temp 97.2°F | Ht 63.0 in | Wt 158.0 lb

## 2020-08-27 DIAGNOSIS — Z0000 Encounter for general adult medical examination without abnormal findings: Secondary | ICD-10-CM

## 2020-08-27 DIAGNOSIS — Z7189 Other specified counseling: Secondary | ICD-10-CM

## 2020-08-27 DIAGNOSIS — Z131 Encounter for screening for diabetes mellitus: Secondary | ICD-10-CM

## 2020-08-27 LAB — GLUCOSE, POCT (MANUAL RESULT ENTRY): POC Glucose: 83 mg/dl (ref 70–99)

## 2020-08-27 NOTE — Patient Instructions (Signed)
Please ask the gyn clinic to send me a note.  Take care.  Glad to see you. Update me as needed.

## 2020-08-27 NOTE — Progress Notes (Signed)
This visit occurred during the SARS-CoV-2 public health emergency.  Safety protocols were in place, including screening questions prior to the visit, additional usage of staff PPE, and extensive cleaning of exam room while observing appropriate contact time as indicated for disinfecting solutions.  CPE- See plan.  Routine anticipatory guidance given to patient.  See health maintenance.  The possibility exists that previously documented standard health maintenance information may have been brought forward from a previous encounter into this note.  If needed, that same information has been updated to reflect the current situation based on today's encounter.    Tetanus 2021 Flu 2021 PNA and shingles not due, d/w pt. Covid vaccine d/w pt. encouraged. Mammogram up to date per gynecology- Jerelyn Charles Pap per gyn, UTD per patient- she seen [facility].   Colon CA screening due at 51 Living will d/w pt.  Husband designated if patient were incapacitated. DXA not due. HIV and HCV screening done with pregnancy. Diet and exercise d/w pt.    Glucose normal at office visit.  Discussed  PMH and SH reviewed  Meds, vitals, and allergies reviewed.   ROS: Per HPI.  Unless specifically indicated otherwise in HPI, the patient denies:  General: fever. Eyes: acute vision changes ENT: sore throat Cardiovascular: chest pain Respiratory: SOB GI: vomiting GU: dysuria Musculoskeletal: acute back pain Derm: acute rash Neuro: acute motor dysfunction Psych: worsening mood Endocrine: polydipsia Heme: bleeding Allergy: hayfever  GEN: nad, alert and oriented HEENT: ncat NECK: supple w/o LA CV: rrr. PULM: ctab, no inc wob ABD: soft, +bs EXT: no edema SKIN: no acute rash

## 2020-08-29 NOTE — Assessment & Plan Note (Signed)
Tetanus 2021 Flu 2021 PNA and shingles not due, d/w pt. Covid vaccine d/w pt. encouraged. Mammogram up to date per gynecology- Jerelyn Charles Pap per gyn, UTD per patient- she seen [facility] OB [facility].   Colon CA screening due at 51 Living will d/w pt.  Husband designated if patient were incapacitated. DXA not due. HIV and HCV screening done with pregnancy. Diet and exercise d/w pt.

## 2020-08-29 NOTE — Assessment & Plan Note (Signed)
Living will d/w pt.  Husband designated if patient were incapacitated.  

## 2020-09-03 ENCOUNTER — Telehealth: Payer: Self-pay

## 2020-09-03 ENCOUNTER — Other Ambulatory Visit: Payer: Self-pay

## 2020-09-03 ENCOUNTER — Ambulatory Visit
Admission: EM | Admit: 2020-09-03 | Discharge: 2020-09-03 | Disposition: A | Discharge status: Home / Self Care | Payer: 59 | Attending: Urgent Care | Admitting: Urgent Care

## 2020-09-03 DIAGNOSIS — R21 Rash and other nonspecific skin eruption: Secondary | ICD-10-CM | POA: Diagnosis not present

## 2020-09-03 DIAGNOSIS — T63441A Toxic effect of venom of bees, accidental (unintentional), initial encounter: Secondary | ICD-10-CM

## 2020-09-03 MED ORDER — TRIAMCINOLONE ACETONIDE 0.1 % EX CREA: 1.0000 "application " | TOPICAL_CREAM | Freq: Two times a day (BID) | CUTANEOUS | 0 refills | Status: DC

## 2020-09-03 MED ORDER — HYDROXYZINE HCL 25 MG PO TABS: 12.5000 mg | ORAL_TABLET | Freq: Three times a day (TID) | ORAL | 0 refills | Status: DC | PRN

## 2020-09-03 NOTE — ED Triage Notes (Signed)
Six days ago Pt was bit on her right posterior calf by a honeybee. Since the sting, the area has become more red and increased in size with an onset yesterday of itchiness. Has been using hydrocortisone and benadryl without relief. No other bites noted. Notes tenderness to the touch, but no pain.

## 2020-09-03 NOTE — Telephone Encounter (Signed)
Pt was stung by a bee over the weekend. She said the site is now red and seems to be spreading out. I advised that she be seen at an UC to make sure it has not gotten infected. She will probably go to [place].

## 2020-09-03 NOTE — ED Provider Notes (Signed)
Katherine Horn   MRN: [id number] DOB: 1970-04-10  Subjective:   Katherine Horn is a 50 y.o. female presenting for persistent rash over the back part of her right leg.  Patient was stung by a bee about 6 days ago.  Has had some itching of the area and.  Mild tenderness.  No warmth, drainage of pus, fever.  Has used hydrocortisone and Benadryl.  No current facility-administered medications for this encounter.  Current Outpatient Medications:    LO LOESTRIN FE 1 MG-10 MCG / 10 MCG tablet, Take 1 tablet by mouth daily., Disp: , Rfl:    Allergies  Allergen Reactions   Erythromycin Base Other (See Comments)    vomiting   Sulfamethoxazole-Trimethoprim Hives    Past Medical History:  Diagnosis Date   GERD (gastroesophageal reflux disease)    Heart murmur    in childhood     Past Surgical History:  Procedure Laterality Date   BILATERAL CARPAL TUNNEL RELEASE Bilateral 2011   CESAREAN SECTION     2001 and 2004    Family History  Problem Relation Age of Onset   Hyperlipidemia Mother    Bladder Cancer Father    Colon cancer Neg Hx    Breast cancer Neg Hx     Social History   Tobacco Use   Smoking status: Former   Smokeless tobacco: Never  Substance Use Topics   Alcohol use: Yes    Comment: 0-2 per week, minimal   Drug use: No    ROS   Objective:   Vitals: BP 113/78 (BP Location: Left Arm)   Pulse 77   Temp 98.2 F (36.8 C) (Oral)   Resp 18   SpO2 98%   Physical Exam Constitutional:      General: She is not in acute distress.    Appearance: Normal appearance. She is well-developed. She is not ill-appearing, toxic-appearing or diaphoretic.  HENT:     Head: Normocephalic and atraumatic.     Nose: Nose normal.     Mouth/Throat:     Mouth: Mucous membranes are moist.     Pharynx: Oropharynx is clear.  Eyes:     General: No scleral icterus.       Right eye: No discharge.        Left eye: No discharge.     Extraocular Movements: Extraocular  movements intact.     Conjunctiva/sclera: Conjunctivae normal.     Pupils: Pupils are equal, round, and reactive to light.  Cardiovascular:     Rate and Rhythm: Normal rate.  Pulmonary:     Effort: Pulmonary effort is normal.  Skin:    General: Skin is warm and dry.     Findings: Rash (hyperpigmented macular rash over posterior right leg as depicted) present.     Comments: No tenderness, drainage of pus or bleeding, induration.  Neurological:     General: No focal deficit present.     Mental Status: She is alert and oriented to person, place, and time.  Psychiatric:        Mood and Affect: Mood normal.        Behavior: Behavior normal.        Thought Content: Thought content normal.        Judgment: Judgment normal.       Assessment and Plan :   PDMP not reviewed this encounter.  1. Rash and nonspecific skin eruption   2. Bee sting, accidental or unintentional, initial encounter     Will  manage conservatively for what I suspect is an ongoing contact dermatitis secondary to the bee sting.  Recommended a stronger steroid and triamcinolone cream, hydroxyzine.  Low suspicion for cellulitis, infected wound given physical exam findings. Counseled patient on potential for adverse effects with medications prescribed/recommended today, ER and return-to-clinic precautions discussed, patient verbalized understanding.    Jaynee Eagles, PA-C 09/03/20 1701

## 2021-05-18 ENCOUNTER — Ambulatory Visit: Payer: 59

## 2021-09-02 ENCOUNTER — Other Ambulatory Visit: Payer: Self-pay

## 2021-09-02 ENCOUNTER — Ambulatory Visit (INDEPENDENT_AMBULATORY_CARE_PROVIDER_SITE_OTHER): Payer: 59 | Admitting: Family Medicine

## 2021-09-02 ENCOUNTER — Telehealth: Payer: Self-pay

## 2021-09-02 ENCOUNTER — Encounter: Payer: Self-pay | Admitting: Family Medicine

## 2021-09-02 VITALS — BP 104/72 | HR 74 | Temp 98.0°F | Ht 63.0 in | Wt 160.0 lb

## 2021-09-02 DIAGNOSIS — Z1211 Encounter for screening for malignant neoplasm of colon: Secondary | ICD-10-CM

## 2021-09-02 DIAGNOSIS — Z0000 Encounter for general adult medical examination without abnormal findings: Secondary | ICD-10-CM | POA: Diagnosis not present

## 2021-09-02 DIAGNOSIS — Z7189 Other specified counseling: Secondary | ICD-10-CM

## 2021-09-02 DIAGNOSIS — M5430 Sciatica, unspecified side: Secondary | ICD-10-CM

## 2021-09-02 DIAGNOSIS — Z131 Encounter for screening for diabetes mellitus: Secondary | ICD-10-CM

## 2021-09-02 DIAGNOSIS — Z13220 Encounter for screening for lipoid disorders: Secondary | ICD-10-CM

## 2021-09-02 MED ORDER — NA SULFATE-K SULFATE-MG SULF 17.5-3.13-1.6 GM/177ML PO SOLN: 1.0000 | Freq: Once | ORAL | 0 refills | Status: AC

## 2021-09-02 NOTE — Progress Notes (Unsigned)
CPE- See plan.  Routine anticipatory guidance given to patient.  See health maintenance.  The possibility exists that previously documented standard health maintenance information may have been brought forward from a previous encounter into this note.  If needed, that same information has been updated to reflect the current situation based on today's encounter.    Tetanus 2021 Flu prev done.   PNA and shingles not due, d/w pt. Covid vaccine d/w pt. encouraged. Mammogram up to date per gynecology- Jerelyn Charles Pap per gyn, UTD per patient- she seen [facility] OB [facility].   D/w patient HQ:PRFFMBW for colon cancer screening, including IFOB vs. colonoscopy.  Risks and benefits of both were discussed and patient voiced understanding.  Pt elects for: colonoscopy.  Referral placed 2023.    Living will d/w pt.  Husband designated if patient were incapacitated. DXA not due. HIV and HCV screening done with pregnancy. Diet and exercise d/w pt.    Still on OCP per gyn clinic.    Occ R sciatica w/o weakness.  No sx now.  Back exercises d/w pt.  Handout given.   PMH and SH reviewed Meds, vitals, and allergies reviewed.   ROS: Per HPI.  Unless specifically indicated otherwise in HPI, the patient denies:  General: fever. Eyes: acute vision changes ENT: sore throat Cardiovascular: chest pain Respiratory: SOB GI: vomiting GU: dysuria Musculoskeletal: acute back pain Derm: acute rash Neuro: acute motor dysfunction Psych: worsening mood Endocrine: polydipsia Heme: bleeding Allergy: hayfever  GEN: nad, alert and oriented HEENT: ncat NECK: supple w/o LA CV: rrr. PULM: ctab, no inc wob ABD: soft, +bs EXT: no edema SKIN: no acute rash

## 2021-09-02 NOTE — Patient Instructions (Signed)
I would get a flu shot each fall.   Thanks for your effort.  Ask the front for a fasting lab visit.  Take care.  Glad to see you.

## 2021-09-02 NOTE — Telephone Encounter (Signed)
Gastroenterology Pre-Procedure Review  Request Date: 10/28/21 Requesting Physician: Dr. Marius Ditch  PATIENT REVIEW QUESTIONS: The patient responded to the following health history questions as indicated:    1. Are you having any GI issues? no 2. Do you have a personal history of Polyps? no 3. Do you have a family history of Colon Cancer or Polyps? no 4. Diabetes Mellitus? no 5. Joint replacements in the past 12 months?no 6. Major health problems in the past 3 months?no 7. Any artificial heart valves, MVP, or defibrillator?no    MEDICATIONS & ALLERGIES:    Patient reports the following regarding taking any anticoagulation/antiplatelet therapy:   Plavix, Coumadin, Eliquis, Xarelto, Lovenox, Pradaxa, Brilinta, or Effient? no Aspirin? no  Patient confirms/reports the following medications:  Current Outpatient Medications  Medication Sig Dispense Refill   LO LOESTRIN FE 1 MG-10 MCG / 10 MCG tablet Take 1 tablet by mouth daily.     No current facility-administered medications for this visit.    Patient confirms/reports the following allergies:  Allergies  Allergen Reactions   Erythromycin Base Other (See Comments)    vomiting   Sulfamethoxazole-Trimethoprim Hives    No orders of the defined types were placed in this encounter.   AUTHORIZATION INFORMATION Primary Insurance: 1D#: Group #:  Secondary Insurance: 1D#: Group #:  SCHEDULE INFORMATION: Date: 10/28/21 Time: Location: [facility]

## 2021-09-04 DIAGNOSIS — M5430 Sciatica, unspecified side: Secondary | ICD-10-CM | POA: Insufficient documentation

## 2021-09-04 NOTE — Assessment & Plan Note (Signed)
Tetanus 2021 Flu prev done.   PNA and shingles not due, d/w pt. Covid vaccine d/w pt. encouraged. Mammogram up to date per gynecology- Jerelyn Charles Pap per gyn, UTD per patient- she seen [facility] OB [facility]YN.   D/w patient CV:[id number] for colon cancer screening, including IFOB vs. colonoscopy.  Risks and benefits of both were discussed and patient voiced understanding.  Pt elects for: colonoscopy.  Referral placed 2023.    Living will d/w pt.  Husband designated if patient were incapacitated. DXA not due. HIV and HCV screening done with pregnancy. Diet and exercise d/w pt.    Still on OCP per gyn clinic.

## 2021-09-04 NOTE — Assessment & Plan Note (Signed)
Living will d/w pt.  Husband designated if patient were incapacitated.  

## 2021-09-04 NOTE — Assessment & Plan Note (Signed)
Occ R sciatica w/o weakness.  No sx now.  Back exercises d/w pt.  Handout given.  Discussed routine home exercise program.  She can update me as needed.

## 2021-09-05 ENCOUNTER — Other Ambulatory Visit (INDEPENDENT_AMBULATORY_CARE_PROVIDER_SITE_OTHER): Payer: 59

## 2021-09-05 ENCOUNTER — Telehealth: Payer: Self-pay

## 2021-09-05 DIAGNOSIS — Z13220 Encounter for screening for lipoid disorders: Secondary | ICD-10-CM

## 2021-09-05 DIAGNOSIS — Z131 Encounter for screening for diabetes mellitus: Secondary | ICD-10-CM | POA: Diagnosis not present

## 2021-09-05 LAB — LIPID PANEL
Cholesterol: 153 mg/dL (ref 0–200)
HDL: 65.1 mg/dL (ref >39.00)
LDL Cholesterol: 74 mg/dL (ref 0–99)
NonHDL: 88.15
Total CHOL/HDL Ratio: 2
Triglycerides: 69 mg/dL (ref 0.0–149.0)
VLDL: 13.8 mg/dL (ref 0.0–40.0)

## 2021-09-05 LAB — GLUCOSE, RANDOM: Glucose, Bld: 90 mg/dL (ref 70–99)

## 2021-09-05 NOTE — Telephone Encounter (Signed)
Returned patients call.  Colonoscopy has been rescheduled from 10/28/21 with Dr. Marius Ditch at [facility] to 12/23/21 at [facility] with Dr. Marius Ditch.  Penny in Endo notified.  Thanks, [place], [place]

## 2021-12-22 ENCOUNTER — Encounter: Payer: Self-pay | Admitting: Gastroenterology

## 2021-12-23 ENCOUNTER — Ambulatory Visit: Payer: 59 | Admitting: Anesthesiology

## 2021-12-23 ENCOUNTER — Other Ambulatory Visit: Payer: Self-pay

## 2021-12-23 ENCOUNTER — Ambulatory Visit
Admission: RE | Admit: 2021-12-23 | Discharge: 2021-12-23 | Disposition: A | Discharge status: Home / Self Care | Payer: 59 | Source: Ambulatory Visit | Attending: Gastroenterology | Admitting: Gastroenterology

## 2021-12-23 ENCOUNTER — Encounter: Payer: Self-pay | Admitting: Gastroenterology

## 2021-12-23 ENCOUNTER — Encounter
Admission: RE | Disposition: A | Discharge status: Home / Self Care | Payer: Self-pay | Source: Ambulatory Visit | Attending: Gastroenterology

## 2021-12-23 DIAGNOSIS — Z87891 Personal history of nicotine dependence: Secondary | ICD-10-CM | POA: Insufficient documentation

## 2021-12-23 DIAGNOSIS — K219 Gastro-esophageal reflux disease without esophagitis: Secondary | ICD-10-CM | POA: Diagnosis not present

## 2021-12-23 DIAGNOSIS — Z1211 Encounter for screening for malignant neoplasm of colon: Secondary | ICD-10-CM | POA: Diagnosis present

## 2021-12-23 HISTORY — PX: COLONOSCOPY WITH PROPOFOL: SHX5780

## 2021-12-23 LAB — POCT PREGNANCY, URINE: Preg Test, Ur: NEGATIVE

## 2021-12-23 SURGERY — COLONOSCOPY WITH PROPOFOL
Anesthesia: General

## 2021-12-23 MED ORDER — SODIUM CHLORIDE 0.9 % IV SOLN: INTRAVENOUS | Status: DC

## 2021-12-23 MED ORDER — LIDOCAINE HCL (CARDIAC) PF 100 MG/5ML IV SOSY
PREFILLED_SYRINGE | INTRAVENOUS | Status: DC | PRN
  Administered 2021-12-23: 50 mg via INTRAVENOUS

## 2021-12-23 MED ORDER — KETAMINE HCL 50 MG/5ML IJ SOSY
PREFILLED_SYRINGE | INTRAMUSCULAR | Status: AC
  Filled 2021-12-23: qty 5

## 2021-12-23 MED ORDER — KETAMINE HCL 10 MG/ML IJ SOLN
INTRAMUSCULAR | Status: DC | PRN
  Administered 2021-12-23: 15 mg via INTRAVENOUS

## 2021-12-23 MED ORDER — SODIUM CHLORIDE 0.9 % IV SOLN: INTRAVENOUS | Status: DC | PRN

## 2021-12-23 MED ORDER — MIDAZOLAM HCL 2 MG/2ML IJ SOLN
INTRAMUSCULAR | Status: AC
  Filled 2021-12-23: qty 2

## 2021-12-23 MED ORDER — PROPOFOL 10 MG/ML IV BOLUS
INTRAVENOUS | Status: DC | PRN
  Administered 2021-12-23: 20 mg via INTRAVENOUS
  Administered 2021-12-23: 50 mg via INTRAVENOUS

## 2021-12-23 MED ORDER — PROPOFOL 1000 MG/100ML IV EMUL
INTRAVENOUS | Status: AC
  Filled 2021-12-23: qty 200

## 2021-12-23 MED ORDER — MIDAZOLAM HCL 2 MG/2ML IJ SOLN
INTRAMUSCULAR | Status: DC | PRN
  Administered 2021-12-23: 2 mg via INTRAVENOUS

## 2021-12-23 MED ORDER — PROPOFOL 500 MG/50ML IV EMUL
INTRAVENOUS | Status: DC | PRN
  Administered 2021-12-23: 100 ug/kg/min via INTRAVENOUS

## 2021-12-23 NOTE — Op Note (Signed)
[facility] Gastroenterology Patient Name: Katherine Horn Procedure Date: 12/23/2021 6:59 AM MRN: [id number] Account #: [id number] Date of Birth: 1971/02/18 Admit Type: Outpatient Age: 51 Room: [facility] ENDO ROOM 1 Gender: Female Note Status: Finalized Instrument Name: Peds Colonoscope [id number] Procedure:             Colonoscopy Indications:           Screening for colorectal malignant neoplasm, This is                         the patient's first colonoscopy Providers:             Lin Landsman MD, MD Referring MD:          No Local Md, MD (Referring MD) Medicines:             General Anesthesia Complications:         No immediate complications. Estimated blood loss: None. Procedure:             Pre-Anesthesia Assessment:                        - Prior to the procedure, a History and Physical was                         performed, and patient medications and allergies were                         reviewed. The patient is competent. The risks and                         benefits of the procedure and the sedation options and                         risks were discussed with the patient. All questions                         were answered and informed consent was obtained.                         Patient identification and proposed procedure were                         verified by the physician, the nurse, the                         anesthesiologist, the anesthetist and the technician                         in the pre-procedure area in the procedure room in the                         endoscopy suite. Mental Status Examination: alert and                         oriented. Airway Examination: normal oropharyngeal                         airway and neck mobility. Respiratory Examination:  clear to auscultation. CV Examination: normal.                         Prophylactic Antibiotics: The patient does not require                          prophylactic antibiotics. Prior Anticoagulants: The                         patient has taken no anticoagulant or antiplatelet                         agents. ASA Grade Assessment: II - A patient with mild                         systemic disease. After reviewing the risks and                         benefits, the patient was deemed in satisfactory                         condition to undergo the procedure. The anesthesia                         plan was to use general anesthesia. Immediately prior                         to administration of medications, the patient was                         re-assessed for adequacy to receive sedatives. The                         heart rate, respiratory rate, oxygen saturations,                         blood pressure, adequacy of pulmonary ventilation, and                         response to care were monitored throughout the                         procedure. The physical status of the patient was                         re-assessed after the procedure.                        After obtaining informed consent, the colonoscope was                         passed under direct vision. Throughout the procedure,                         the patient's blood pressure, pulse, and oxygen                         saturations were monitored continuously. The  colonoscopy was performed with moderate difficulty due                         to restricted mobility of the colon. Successful                         completion of the procedure was aided by withdrawing                         the scope and replacing with the pediatric                         colonoscope. The Colonoscope was introduced through                         the anus and advanced to the the cecum, identified by                         appendiceal orifice and ileocecal valve. The patient                         tolerated the procedure well. The quality of the bowel                          preparation was evaluated using the BBPS [facility] Bowel                         Preparation Scale) with scores of: Right Colon = 3,                         Transverse Colon = 3 and Left Colon = 3 (entire mucosa                         seen well with no residual staining, small fragments                         of stool or opaque liquid). The total BBPS score                         equals 9. The ileocecal valve, appendiceal orifice,                         and rectum were photographed. Findings:      The perianal and digital rectal examinations were normal. Pertinent       negatives include normal sphincter tone and no palpable rectal lesions.      The entire examined colon appeared normal.      The retroflexed view of the distal rectum and anal verge was normal and       showed no anal or rectal abnormalities. Impression:            - The entire examined colon is normal.                        - The distal rectum and anal verge are normal on  retroflexion view.                        - No specimens collected. Recommendation:        - Discharge patient to home (with escort).                        - Resume previous diet today.                        - Continue present medications.                        - Repeat colonoscopy in 10 years for screening                         purposes. Procedure Code(s):     --- Professional ---                        H9622, Colorectal cancer screening; colonoscopy on                         individual not meeting criteria for high risk Diagnosis Code(s):     --- Professional ---                        Z12.11, Encounter for screening for malignant neoplasm                         of colon CPT copyright 2022 American Medical Association. All rights reserved. The codes documented in this report are preliminary and upon coder review may  be revised to meet current compliance requirements. Dr. Ulyess Mort Lin Landsman  MD, MD 12/23/2021 8:25:07 AM This report has been signed electronically. Number of Addenda: 0 Note Initiated On: 12/23/2021 6:59 AM Scope Withdrawal Time: 0 hours 7 minutes 58 seconds  Total Procedure Duration: 0 hours 21 minutes 37 seconds  Estimated Blood Loss:  Estimated blood loss: none.      [facility]

## 2021-12-23 NOTE — Anesthesia Postprocedure Evaluation (Signed)
Anesthesia Post Note  Patient: Katherine Horn  Procedure(s) Performed: COLONOSCOPY WITH PROPOFOL  Patient location during evaluation: PACU Anesthesia Type: General Level of consciousness: awake and awake and alert Pain management: satisfactory to patient Vital Signs Assessment: post-procedure vital signs reviewed and stable Respiratory status: spontaneous breathing and nonlabored ventilation Cardiovascular status: stable Anesthetic complications: no  No notable events documented.   Last Vitals:  Vitals:   12/23/21 0839 12/23/21 0849  BP: 118/83 (!) 135/92  Pulse: 71   Resp: 11 12  Temp:    SpO2: 100% 100%    Last Pain:  Vitals:   12/23/21 0828  TempSrc: Temporal  PainSc: 0-No pain                 VAN STAVEREN,[place]

## 2021-12-23 NOTE — Anesthesia Preprocedure Evaluation (Signed)
Anesthesia Evaluation  Patient identified by MRN, date of birth, ID band Patient awake    Reviewed: Allergy & Precautions, NPO status , Patient's Chart, lab work & pertinent test results  Airway Mallampati: II  TM Distance: >3 FB Neck ROM: Full    Dental  (+) Teeth Intact   Pulmonary neg pulmonary ROS, former smoker   Pulmonary exam normal breath sounds clear to auscultation       Cardiovascular Exercise Tolerance: Good negative cardio ROS Normal cardiovascular exam Rhythm:Regular     Neuro/Psych negative neurological ROS  negative psych ROS   GI/Hepatic negative GI ROS, Neg liver ROS,,,  Endo/Other  negative endocrine ROS    Renal/GU negative Renal ROS  negative genitourinary   Musculoskeletal   Abdominal   Peds negative pediatric ROS (+)  Hematology negative hematology ROS (+)   Anesthesia Other Findings Past Medical History: No date: GERD (gastroesophageal reflux disease) No date: Heart murmur     Comment:  in childhood  Past Surgical History: 2011: BILATERAL CARPAL TUNNEL RELEASE; Bilateral No date: CESAREAN SECTION     Comment:  2001 and 2004     Reproductive/Obstetrics negative OB ROS                             Anesthesia Physical Anesthesia Plan  ASA: 2  Anesthesia Plan: General   Post-op Pain Management:    Induction: Intravenous  PONV Risk Score and Plan: Propofol infusion and TIVA  Airway Management Planned: Natural Airway  Additional Equipment:   Intra-op Plan:   Post-operative Plan:   Informed Consent: I have reviewed the patients History and Physical, chart, labs and discussed the procedure including the risks, benefits and alternatives for the proposed anesthesia with the patient or authorized representative who has indicated his/her understanding and acceptance.     Dental Advisory Given  Plan Discussed with: CRNA and Surgeon  Anesthesia Plan  Comments:        Anesthesia Quick Evaluation

## 2021-12-23 NOTE — H&P (Signed)
  Katherine Darby, MD [address]  [place]  [place], [place] [address]  Main: [phone]  Fax: [phone] Pager: [phone]  Primary Care Physician:  Tonia Ghent, MD Primary Gastroenterologist:  Dr. Cephas Horn  Pre-Procedure History & Physical: HPI:  Katherine Horn is a 51 y.o. female is here for an colonoscopy.   Past Medical History:  Diagnosis Date   GERD (gastroesophageal reflux disease)    Heart murmur    in childhood    Past Surgical History:  Procedure Laterality Date   BILATERAL CARPAL TUNNEL RELEASE Bilateral 2011   CESAREAN SECTION     2001 and 2004    Prior to Admission medications   Medication Sig Start Date End Date Taking? Authorizing Provider  LO LOESTRIN FE 1 MG-10 MCG / 10 MCG tablet Take 1 tablet by mouth daily. 08/08/20   [provider]    Allergies as of 09/02/2021 - Review Complete 09/02/2021  Allergen Reaction Noted   Erythromycin base Other (See Comments)    Sulfamethoxazole-trimethoprim Hives     Family History  Problem Relation Age of Onset   Hyperlipidemia Mother    Bladder Cancer Father    Colon cancer Neg Hx    Breast cancer Neg Hx     Social History   Socioeconomic History   Marital status: Married    Spouse name: Not on file   Number of children: Not on file   Years of education: Not on file   Highest education Horn: Not on file  Occupational History   Not on file  Tobacco Use   Smoking status: Former   Smokeless tobacco: Never  Vaping Use   Vaping Use: Never used  Substance and Sexual Activity   Alcohol use: Yes    Comment: 0-2 per week, minimal   Drug use: No   Sexual activity: Yes    Partners: Female    Birth control/protection: None  Other Topics Concern   Not on file  Social History Narrative   Married 1999   2 kids   Associates degree   Works in Estate manager/land agent at [employer], off in the summers.     Social Determinants of Health   Financial Resource Strain: Not on  file  Food Insecurity: Not on file  Transportation Needs: Not on file  Physical Activity: Not on file  Stress: Not on file  Social Connections: Not on file  Intimate Partner Violence: Not on file    Review of Systems: See HPI, otherwise negative ROS  Physical Exam: BP 139/80   Pulse 84   Temp (!) 97.4 F (36.3 C) (Temporal)   Resp 18   Ht '5\' 4"'$  (1.626 m)   Wt 71.2 kg   SpO2 100%   BMI 26.95 kg/m  General:   Alert,  pleasant and cooperative in NAD Head:  Normocephalic and atraumatic. Neck:  Supple; no masses or thyromegaly. Lungs:  Clear throughout to auscultation.    Heart:  Regular rate and rhythm. Abdomen:  Soft, nontender and nondistended. Normal bowel sounds, without guarding, and without rebound.   Neurologic:  Alert and  oriented x4;  grossly normal neurologically.  Impression/Plan: Katherine Horn is here for an colonoscopy to be performed for colon cancer screening  Risks, benefits, limitations, and alternatives regarding  colonoscopy have been reviewed with the patient.  Questions have been answered.  All parties agreeable.   Sherri Sear, MD  12/23/2021, 7:51 AM

## 2021-12-23 NOTE — Transfer of Care (Addendum)
Immediate Anesthesia Transfer of Care Note  Patient: Katherine Horn  Procedure(s) Performed: COLONOSCOPY WITH PROPOFOL  Patient Location: PACU and Endoscopy Unit  Anesthesia Type:MAC  Level of Consciousness: drowsy  Airway & Oxygen Therapy: Patient Spontanous Breathing  Post-op Assessment: Report given to RN and Post -op Vital signs reviewed and stable  Post vital signs: Reviewed and stable  Last Vitals:  Vitals Value Taken Time  BP    Temp    Pulse    Resp    SpO2      Last Pain:  Vitals:   12/23/21 0705  TempSrc: Temporal  PainSc: 0-No pain         Complications: No notable events documented.

## 2021-12-26 ENCOUNTER — Encounter: Payer: Self-pay | Admitting: Gastroenterology

## 2021-12-28 ENCOUNTER — Ambulatory Visit: Payer: 59 | Admitting: Family Medicine

## 2022-01-25 ENCOUNTER — Encounter: Payer: Self-pay | Admitting: Podiatry

## 2022-01-25 ENCOUNTER — Ambulatory Visit (INDEPENDENT_AMBULATORY_CARE_PROVIDER_SITE_OTHER): Payer: 59

## 2022-01-25 ENCOUNTER — Ambulatory Visit: Payer: 59 | Admitting: Podiatry

## 2022-01-25 DIAGNOSIS — M722 Plantar fascial fibromatosis: Secondary | ICD-10-CM

## 2022-01-25 DIAGNOSIS — M778 Other enthesopathies, not elsewhere classified: Secondary | ICD-10-CM

## 2022-01-25 MED ORDER — MELOXICAM 15 MG PO TABS: 15.0000 mg | ORAL_TABLET | Freq: Every day | ORAL | 3 refills | Status: DC

## 2022-01-25 MED ORDER — TRIAMCINOLONE ACETONIDE 40 MG/ML IJ SUSP
40.0000 mg | Freq: Once | INTRAMUSCULAR | Status: AC
  Administered 2022-01-25: 40 mg

## 2022-01-25 MED ORDER — METHYLPREDNISOLONE 4 MG PO TBPK: ORAL_TABLET | ORAL | 0 refills | Status: DC

## 2022-01-25 NOTE — Patient Instructions (Signed)

## 2022-01-25 NOTE — Progress Notes (Signed)
  Subjective:  Patient ID: Katherine Horn, female    DOB: [birth year],  MRN: [id number] HPI Chief Complaint  Patient presents with   Foot Pain    Plantar heel bilateral - aching x several months, AM pain, tried advil, water bottle massage, OTC inserts-no help   New Patient (Initial Visit)    51 y.o. female presents with the above complaint.   ROS: Denies fever chills nausea vomit muscle aches pains calf pain back pain chest pain shortness of breath.  Past Medical History:  Diagnosis Date   GERD (gastroesophageal reflux disease)    Heart murmur    in childhood   Past Surgical History:  Procedure Laterality Date   BILATERAL CARPAL TUNNEL RELEASE Bilateral 2011   CESAREAN SECTION     2001 and 2004   COLONOSCOPY WITH PROPOFOL N/A 12/23/2021   Procedure: COLONOSCOPY WITH PROPOFOL;  Surgeon: Lin Landsman, MD;  Location: [facility] ENDOSCOPY;  Service: Gastroenterology;  Laterality: N/A;    Current Outpatient Medications:    meloxicam (MOBIC) 15 MG tablet, Take 1 tablet (15 mg total) by mouth daily., Disp: 30 tablet, Rfl: 3   methylPREDNISolone (MEDROL DOSEPAK) 4 MG TBPK tablet, 6 day dose pack - take as directed, Disp: 21 tablet, Rfl: 0   LO LOESTRIN FE 1 MG-10 MCG / 10 MCG tablet, Take 1 tablet by mouth daily., Disp: , Rfl:   Allergies  Allergen Reactions   Erythromycin Base Other (See Comments)    vomiting   Sulfamethoxazole-Trimethoprim Hives   Review of Systems Objective:  There were no vitals filed for this visit.  General: Well developed, nourished, in no acute distress, alert and oriented x3   Dermatological: Skin is warm, dry and supple bilateral. Nails x 10 are well maintained; remaining integument appears unremarkable at this time. There are no open sores, no preulcerative lesions, no rash or signs of infection present.  Vascular: Dorsalis Pedis artery and Posterior Tibial artery pedal pulses are 2/4 bilateral with immedate capillary fill time. Pedal hair growth  present. No varicosities and no lower extremity edema present bilateral.   Neruologic: Grossly intact via light touch bilateral. Vibratory intact via tuning fork bilateral. Protective threshold with Semmes Wienstein monofilament intact to all pedal sites bilateral. Patellar and Achilles deep tendon reflexes 2+ bilateral. No Babinski or clonus noted bilateral.   Musculoskeletal: No gross boney pedal deformities bilateral. No pain, crepitus, or limitation noted with foot and ankle range of motion bilateral. Muscular strength 5/5 in all groups tested bilateral.  She has pain on palpation medial calcaneal tubercle bilateral.  No pain on palpation of the lateral aspect of the foot or the Achilles.  No pain on medial and lateral compression of the calcaneus.  Gait: Unassisted, Nonantalgic.    Radiographs:  Radiographs taken today demonstrate an osseously mature individual plantar fascial increase in density at its insertion site. Assessment & Plan:   Assessment: Plan fasciitis bilateral.  Plan: Injected bilateral heels today 20 mg Kenalog 5 mg Marcaine point maximal tenderness to the bilateral plantar fascial calcaneal insertion site.  Started her on methylprednisolone to be followed by meloxicam.  Plantar fascia braces were dispensed discussed appropriate shoe gear stretching exercise ice therapy sugar modifications.  I will follow-up with Angie in 4 to 6 weeks.     Drishti Pepperman T. [place], [place]

## 2022-02-22 ENCOUNTER — Ambulatory Visit: Payer: 59 | Admitting: Podiatry

## 2022-02-22 ENCOUNTER — Encounter: Payer: Self-pay | Admitting: Podiatry

## 2022-02-22 DIAGNOSIS — M722 Plantar fascial fibromatosis: Secondary | ICD-10-CM | POA: Diagnosis not present

## 2022-02-22 NOTE — Progress Notes (Signed)
She presents today for follow-up of her bilateral Planter fasciitis she states that is 90+ percent better she has not been taking her meloxicam as prescribed.  Objective: Vital signs are stable she is alert and oriented x 3 there is no erythema edema cellulitis drainage odor much decrease pain on palpation much decreased warmth and induration.  Assessment: Resolving Planter fasciitis.  Plan: Did discuss the possible need for orthotics.  We will continue her meloxicam orally daily until she is 100% better plus about 1 month.

## 2022-02-28 ENCOUNTER — Ambulatory Visit: Payer: 59

## 2022-04-27 ENCOUNTER — Encounter: Payer: Self-pay | Admitting: Podiatry

## 2022-04-27 ENCOUNTER — Ambulatory Visit: Payer: 59 | Admitting: Podiatry

## 2022-04-27 DIAGNOSIS — M722 Plantar fascial fibromatosis: Secondary | ICD-10-CM

## 2022-04-27 MED ORDER — DICLOFENAC SODIUM 75 MG PO TBEC: 75.0000 mg | DELAYED_RELEASE_TABLET | Freq: Two times a day (BID) | ORAL | 1 refills | Status: DC

## 2022-04-27 MED ORDER — TRIAMCINOLONE ACETONIDE 40 MG/ML IJ SUSP
40.0000 mg | Freq: Once | INTRAMUSCULAR | Status: AC
  Administered 2022-04-27: 40 mg

## 2022-04-27 NOTE — Progress Notes (Signed)
She presents today for follow-up of her bilateral Planter fasciitis states that they were doing great no problems whatsoever now the right was dropped down to about 60% in the left 1 to about 70%.  They were 90% last visit in January.  Objective: Vital signs are stable she alert oriented x 3 reproducible pain palpation medial calcaneal tubercle of the right heel.  Similar findings left heel but not as dense tissue present.  Assessment: Planter fasciitis bilateral.  Plan: With this reoccurrence I reinjected the bilateral heels today 20 mg Kenalog 5 mg Marcaine.  Started her on Voltaren/diclofenac 75 mg 1 p.o. twice daily #60 with 3 refills.  We did discuss the necessity for orthotics should this fail to alleviate her symptoms in total.

## 2022-05-15 ENCOUNTER — Encounter: Payer: Self-pay | Admitting: Podiatry

## 2022-06-06 ENCOUNTER — Encounter: Payer: Self-pay | Admitting: Podiatry

## 2022-06-06 ENCOUNTER — Ambulatory Visit: Payer: 59 | Admitting: Podiatry

## 2022-06-06 DIAGNOSIS — M722 Plantar fascial fibromatosis: Secondary | ICD-10-CM | POA: Diagnosis not present

## 2022-06-06 MED ORDER — TRIAMCINOLONE ACETONIDE 40 MG/ML IJ SUSP
20.0000 mg | Freq: Once | INTRAMUSCULAR | Status: AC
  Administered 2022-06-06: 20 mg

## 2022-06-06 NOTE — Progress Notes (Signed)
She presents today for follow-up of her bilateral Planter fasciitis states that the right was doing okay the left which is giving me a lot of grief can barely walk on it Sunday was the breaking point.  She states that something needs to be done.  Objective: Vital signs stable alert oriented x 3.  Pulses are palpable.  She continues her diclofenac and still has pain on palpation MucoClear tubercle of the left heel.  Assessment: Planter fasciitis bilaterally right is doing well left is chronic with recurrence.  Plan: Discussed etiology pathology conservative surgical therapies I reinjected today recommend she continue her Voltaren oral medication.  Also recommended orthotics which we will schedule her to have made.  I am going to follow-up with her in about 1 month to see how she is doing.  If this is still painful or has regressed we will consider MRI.

## 2022-06-13 ENCOUNTER — Ambulatory Visit: Payer: 59 | Admitting: Podiatry

## 2022-06-20 ENCOUNTER — Ambulatory Visit: Payer: 59 | Admitting: Podiatry

## 2022-06-21 ENCOUNTER — Other Ambulatory Visit: Payer: Self-pay | Admitting: Podiatry

## 2022-06-26 ENCOUNTER — Other Ambulatory Visit: Payer: 59

## 2022-07-20 ENCOUNTER — Ambulatory Visit (INDEPENDENT_AMBULATORY_CARE_PROVIDER_SITE_OTHER): Payer: 59 | Admitting: Podiatry

## 2022-07-20 DIAGNOSIS — M722 Plantar fascial fibromatosis: Secondary | ICD-10-CM

## 2022-07-20 NOTE — Progress Notes (Signed)
Patient presents today to pick up custom molded foot orthotics recommended by Dr. HYATT.   Orthotics were dispensed and fit was satisfactory. Reviewed instructions for break-in and wear. Written instructions given to patient.  Patient will follow up as needed.     

## 2022-08-15 ENCOUNTER — Other Ambulatory Visit: Payer: Self-pay | Admitting: Podiatry

## 2022-08-16 ENCOUNTER — Encounter: Payer: Self-pay | Admitting: Podiatry

## 2022-08-16 ENCOUNTER — Ambulatory Visit: Payer: 59 | Admitting: Podiatry

## 2022-08-16 DIAGNOSIS — M722 Plantar fascial fibromatosis: Secondary | ICD-10-CM | POA: Diagnosis not present

## 2022-08-16 NOTE — Progress Notes (Signed)
Angie presents today for follow-up of her bilateral Planter fasciitis states that she has had some numbness that started prior to her orthotics along the lateral side of her left foot.  States that he just really has not improved since she has gotten orthotics that her Planter fasciitis seems to be doing better.  Objective: Vital signs are stable she is alert and oriented x 3 she has no reproducible paresthesias or numbness to toes 4 and 5 of the left foot.  She does have some tenderness on the lateral aspect of the foot and still has some tenderness on palpation of the medial band of the plantar fascia at its insertion site left.  States the right foot is almost 100% better.  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  Resolving Planter fasciitis.  Plan: Continue the use of the orthotics and of the diclofenac.  We will make sure that she has enough of that.

## 2022-08-20 ENCOUNTER — Encounter: Payer: Self-pay | Admitting: Family Medicine

## 2022-08-20 ENCOUNTER — Other Ambulatory Visit: Payer: Self-pay | Admitting: Family Medicine

## 2022-08-20 DIAGNOSIS — Z0000 Encounter for general adult medical examination without abnormal findings: Secondary | ICD-10-CM | POA: Insufficient documentation

## 2022-08-20 DIAGNOSIS — Z131 Encounter for screening for diabetes mellitus: Secondary | ICD-10-CM

## 2022-08-25 ENCOUNTER — Other Ambulatory Visit (INDEPENDENT_AMBULATORY_CARE_PROVIDER_SITE_OTHER): Payer: 59

## 2022-08-25 DIAGNOSIS — Z131 Encounter for screening for diabetes mellitus: Secondary | ICD-10-CM | POA: Diagnosis not present

## 2022-08-25 LAB — GLUCOSE, RANDOM: Glucose, Bld: 83 mg/dL (ref 70–99)

## 2022-08-31 ENCOUNTER — Ambulatory Visit (INDEPENDENT_AMBULATORY_CARE_PROVIDER_SITE_OTHER): Payer: 59 | Admitting: Family Medicine

## 2022-08-31 ENCOUNTER — Encounter: Payer: Self-pay | Admitting: Family Medicine

## 2022-08-31 VITALS — BP 112/70 | HR 74 | Temp 98.1°F | Ht 64.0 in | Wt 154.0 lb

## 2022-08-31 DIAGNOSIS — Z7189 Other specified counseling: Secondary | ICD-10-CM

## 2022-08-31 DIAGNOSIS — Z0000 Encounter for general adult medical examination without abnormal findings: Secondary | ICD-10-CM | POA: Diagnosis not present

## 2022-08-31 NOTE — Patient Instructions (Addendum)
Check with your insurance to see if they will cover the shingles shot. I would get a flu shot each fall.   Take care.  Glad to see you.  

## 2022-08-31 NOTE — Progress Notes (Signed)
CPE- See plan.  Routine anticipatory guidance given to patient.  See health maintenance.  The possibility exists that previously documented standard health maintenance information may have been brought forward from a previous encounter into this note.  If needed, that same information has been updated to reflect the current situation based on today's encounter.    Tetanus 2021 Flu due this fall.    PNA not due.  shingles d/w pt Covid vaccine d/w pt prev. Mammogram up to date per gynecology- Marlow Baars Pap per gyn, UTD per patient- she seen [facility] OB [facility]YN.   Colonoscopy 2023 Living will d/w pt.  Husband designated if patient were incapacitated. DXA not due. HIV and HCV screening done with pregnancy. Diet and exercise d/w pt.    She is improving with plantar fasciitis.  Taking diclofenac.    On OCPs per gyn.  Will f/u this year.    PMH and SH reviewed  Meds, vitals, and allergies reviewed.   ROS: Per HPI.  Unless specifically indicated otherwise in HPI, the patient denies:  General: fever. Eyes: acute vision changes ENT: sore throat Cardiovascular: chest pain Respiratory: SOB GI: vomiting GU: dysuria Musculoskeletal: acute back pain Derm: acute rash Neuro: acute motor dysfunction Psych: worsening mood Endocrine: polydipsia Heme: bleeding Allergy: hayfever  GEN: nad, alert and oriented HEENT: mucous membranes moist NECK: supple w/o LA CV: rrr. PULM: ctab, no inc wob ABD: soft, +bs EXT: no edema SKIN: Well-perfused

## 2022-09-02 NOTE — Assessment & Plan Note (Signed)
Living will d/w pt.  Husband designated if patient were incapacitated.  

## 2022-09-02 NOTE — Assessment & Plan Note (Signed)
Tetanus 2021 Flu due this fall.    PNA not due.  shingles d/w pt Covid vaccine d/w pt prev. Mammogram up to date per gynecology- Marlow Baars Pap per gyn, UTD per patient- she seen [facility] OB GYN.   Colonoscopy 2023 Living will d/w pt.  Husband designated if patient were incapacitated. DXA not due. HIV and HCV screening done with pregnancy. Diet and exercise d/w pt.

## 2022-10-09 ENCOUNTER — Other Ambulatory Visit: Payer: Self-pay | Admitting: Podiatry

## 2022-12-02 ENCOUNTER — Other Ambulatory Visit: Payer: Self-pay | Admitting: Podiatry

## 2023-02-02 ENCOUNTER — Other Ambulatory Visit: Payer: Self-pay | Admitting: Podiatry

## 2023-03-05 ENCOUNTER — Encounter: Payer: Self-pay | Admitting: Family Medicine

## 2023-03-06 ENCOUNTER — Other Ambulatory Visit: Payer: Self-pay | Admitting: Family Medicine

## 2023-03-06 ENCOUNTER — Telehealth: Payer: Self-pay | Admitting: Family Medicine

## 2023-03-06 MED ORDER — HYDROXYZINE HCL 10 MG PO TABS: 10.0000 mg | ORAL_TABLET | Freq: Three times a day (TID) | ORAL | 0 refills | Status: DC | PRN

## 2023-03-06 NOTE — Telephone Encounter (Signed)
 See mychart message re: concerns with her son and please check with patient about her situation.  Please make sure she is safe at home and off OV if needed.  Thanks.

## 2023-03-07 NOTE — Telephone Encounter (Signed)
 Left voicemail for patient to return call to office.

## 2023-03-09 NOTE — Telephone Encounter (Signed)
Spoke with patient she states that she is doing okay and will call to schedule if need be. She has picked up medication

## 2023-06-21 ENCOUNTER — Other Ambulatory Visit: Payer: Self-pay | Admitting: Family Medicine

## 2023-06-21 MED ORDER — HYDROXYZINE HCL 10 MG PO TABS: 10.0000 mg | ORAL_TABLET | Freq: Three times a day (TID) | ORAL | 0 refills | Status: DC | PRN

## 2023-09-06 ENCOUNTER — Encounter: Payer: Self-pay | Admitting: Family Medicine

## 2023-09-06 ENCOUNTER — Ambulatory Visit (INDEPENDENT_AMBULATORY_CARE_PROVIDER_SITE_OTHER): Admitting: Family Medicine

## 2023-09-06 VITALS — BP 118/78 | HR 81 | Temp 98.9°F | Ht 63.39 in | Wt 159.2 lb

## 2023-09-06 DIAGNOSIS — Z0000 Encounter for general adult medical examination without abnormal findings: Secondary | ICD-10-CM

## 2023-09-06 DIAGNOSIS — Z23 Encounter for immunization: Secondary | ICD-10-CM | POA: Diagnosis not present

## 2023-09-06 DIAGNOSIS — Z659 Problem related to unspecified psychosocial circumstances: Secondary | ICD-10-CM | POA: Insufficient documentation

## 2023-09-06 DIAGNOSIS — Z7189 Other specified counseling: Secondary | ICD-10-CM

## 2023-09-06 MED ORDER — DICLOFENAC SODIUM 75 MG PO TBEC: 75.0000 mg | DELAYED_RELEASE_TABLET | Freq: Two times a day (BID) | ORAL | Status: AC | PRN

## 2023-09-06 MED ORDER — HYDROXYZINE HCL 10 MG PO TABS: 10.0000 mg | ORAL_TABLET | Freq: Every evening | ORAL | Status: DC | PRN

## 2023-09-06 NOTE — Progress Notes (Signed)
 CPE- See plan.  Routine anticipatory guidance given to patient.  See health maintenance.  The possibility exists that previously documented standard health maintenance information may have been brought forward from a previous encounter into this note.  If needed, that same information has been updated to reflect the current situation based on today's encounter.    Tetanus 2021 Flu due this fall.    PNA not due.  shingles 1st dose 09/06/23 Covid vaccine d/w pt prev. Mammogram up to date per gynecology- Jolene Gaskins Pap per gyn, UTD per patient- she seen [facility] OB [facility]YN.   Colonoscopy 2023 Living will d/w pt.  Husband designated if patient were incapacitated. DXA not due. HIV and HCV screening done with pregnancy. Diet and exercise d/w pt.    She is going to check with GYN clinic re: loestrin use.  I will defer.  She agrees.  Taking diclofenac  for foot pain.  That helps some.  Taking hydroxyzine  as needed, worried about her son.  She is safe at home.  Used prn at night.  D/w pt about inc from 10 to 20 vs 30mg  as needed.    PMH and SH reviewed  Meds, vitals, and allergies reviewed.   ROS: Per HPI.  Unless specifically indicated otherwise in HPI, the patient denies:  General: fever. Eyes: acute vision changes ENT: sore throat Cardiovascular: chest pain Respiratory: SOB GI: vomiting GU: dysuria Musculoskeletal: acute back pain Derm: acute rash Neuro: acute motor dysfunction Psych: worsening mood Endocrine: polydipsia Heme: bleeding Allergy: hayfever  GEN: nad, alert and oriented HEENT: ncat NECK: supple w/o LA CV: rrr. PULM: ctab, no inc wob ABD: soft, +bs EXT: no edema SKIN: Well-perfused  Defer labs given reassuring previous labs and exam today.  Discussed.  She agrees.

## 2023-09-06 NOTE — Assessment & Plan Note (Signed)
 Taking hydroxyzine  as needed, worried about her son.  She is safe at home.  Used prn at night.  D/w pt about inc from 10 to 20 vs 30mg  as needed.

## 2023-09-06 NOTE — Assessment & Plan Note (Signed)
 Tetanus 2021 Flu due this fall.    PNA not due.  shingles 1st dose 09/06/23 Covid vaccine d/w pt prev. Mammogram up to date per gynecology- Jolene Gaskins Pap per gyn, UTD per patient- she seen [facility] OB GYN.   Colonoscopy 2023 Living will d/w pt.  Husband designated if patient were incapacitated. DXA not due. HIV and HCV screening done with pregnancy. Diet and exercise d/w pt.

## 2023-09-06 NOTE — Patient Instructions (Signed)
Thank you for your effort.  Take care.  Glad to see you.  Update me as needed.  

## 2023-09-06 NOTE — Assessment & Plan Note (Signed)
Living will d/w pt.  Husband designated if patient were incapacitated.  

## 2023-11-15 ENCOUNTER — Telehealth: Payer: Self-pay

## 2023-11-15 NOTE — Telephone Encounter (Signed)
 Copied from CRM #[id number]. Topic: Appointments - Scheduling Inquiry for Clinic >> Nov 14, 2023  4:45 PM Sophia H wrote: Reason for CRM: Patient is wanting to schedule in her shingles vaccine (2nd round). Please reach out for scheduling, ty. # [phone]

## 2023-11-15 NOTE — Telephone Encounter (Signed)
 Lvm to schedule appt.

## 2023-11-15 NOTE — Telephone Encounter (Signed)
 Please call patient and schedule her for her 2nd shingles vaccine with a nurse visit. Thank you

## 2023-11-16 NOTE — Telephone Encounter (Signed)
 Patient notified

## 2023-11-16 NOTE — Telephone Encounter (Unsigned)
 Copied from CRM #[id number]. Topic: Clinical - Request for Lab/Test Order >> Nov 15, 2023  1:42 PM Mia F wrote: Reason for CRM: Pt  is calling to schedule 2nd shingles vaccine. Per [facility] CRM must be sent to clinic for scheduling

## 2023-11-29 ENCOUNTER — Ambulatory Visit (INDEPENDENT_AMBULATORY_CARE_PROVIDER_SITE_OTHER)

## 2023-11-29 DIAGNOSIS — Z23 Encounter for immunization: Secondary | ICD-10-CM

## 2023-11-29 NOTE — Progress Notes (Signed)
 Per orders of Dr. Arlyss Solian, injection of 2nd  Shingles  given by Harlene KATHEE Arenas in left deltoid. Patient tolerated injection well.

## 2023-12-11 ENCOUNTER — Other Ambulatory Visit: Payer: Self-pay | Admitting: Family Medicine

## 2023-12-13 NOTE — Telephone Encounter (Signed)
 Med is on med list as no print not sent on 09/06/23  CPE was on 09/06/23

## 2023-12-14 NOTE — Telephone Encounter (Signed)
 Sent. Thanks.
